# Patient Record
Sex: Female | Born: 1960 | Race: White | Hispanic: No | Smoking: Current every day smoker
Health system: Southern US, Community
[De-identification: ages and names within clinical notes are randomized; demographics above are authoritative.]

## PROBLEM LIST (undated history)

## (undated) DIAGNOSIS — G4730 Sleep apnea, unspecified: Secondary | ICD-10-CM

## (undated) DIAGNOSIS — E785 Hyperlipidemia, unspecified: Secondary | ICD-10-CM

## (undated) DIAGNOSIS — Z720 Tobacco use: Secondary | ICD-10-CM

## (undated) DIAGNOSIS — F32A Depression, unspecified: Secondary | ICD-10-CM

## (undated) DIAGNOSIS — R251 Tremor, unspecified: Secondary | ICD-10-CM

## (undated) DIAGNOSIS — F419 Anxiety disorder, unspecified: Secondary | ICD-10-CM

## (undated) DIAGNOSIS — K219 Gastro-esophageal reflux disease without esophagitis: Secondary | ICD-10-CM

## (undated) DIAGNOSIS — J189 Pneumonia, unspecified organism: Secondary | ICD-10-CM

## (undated) DIAGNOSIS — Z8719 Personal history of other diseases of the digestive system: Secondary | ICD-10-CM

## (undated) DIAGNOSIS — K760 Fatty (change of) liver, not elsewhere classified: Secondary | ICD-10-CM

## (undated) DIAGNOSIS — I10 Essential (primary) hypertension: Secondary | ICD-10-CM

## (undated) DIAGNOSIS — M1990 Unspecified osteoarthritis, unspecified site: Secondary | ICD-10-CM

## (undated) DIAGNOSIS — I6529 Occlusion and stenosis of unspecified carotid artery: Secondary | ICD-10-CM

## (undated) DIAGNOSIS — E119 Type 2 diabetes mellitus without complications: Secondary | ICD-10-CM

## (undated) HISTORY — DX: Tobacco use: Z72.0

## (undated) HISTORY — DX: Occlusion and stenosis of unspecified carotid artery: I65.29

## (undated) HISTORY — PX: TUBAL LIGATION: SHX77

## (undated) HISTORY — PX: UMBILICAL HERNIA REPAIR: SHX196

## (undated) HISTORY — DX: Hyperlipidemia, unspecified: E78.5

## (undated) HISTORY — PX: KNEE ARTHROSCOPY: SUR90

## (undated) HISTORY — PX: CARPAL TUNNEL RELEASE: SHX101

## (undated) HISTORY — PX: BACK SURGERY: SHX140

---

## 2020-01-07 ENCOUNTER — Other Ambulatory Visit: Payer: Self-pay | Admitting: Sports Medicine

## 2020-01-07 DIAGNOSIS — M545 Low back pain, unspecified: Secondary | ICD-10-CM

## 2020-01-26 ENCOUNTER — Other Ambulatory Visit: Payer: Self-pay

## 2020-01-26 ENCOUNTER — Ambulatory Visit
Admission: RE | Admit: 2020-01-26 | Discharge: 2020-01-26 | Disposition: A | Discharge status: Home / Self Care | Payer: Medicaid Other | Source: Ambulatory Visit | Attending: Sports Medicine | Admitting: Sports Medicine

## 2020-01-26 DIAGNOSIS — M545 Low back pain, unspecified: Secondary | ICD-10-CM

## 2021-03-02 ENCOUNTER — Other Ambulatory Visit: Payer: Self-pay | Admitting: Family Medicine

## 2021-03-02 DIAGNOSIS — Z87891 Personal history of nicotine dependence: Secondary | ICD-10-CM

## 2021-03-02 DIAGNOSIS — Z122 Encounter for screening for malignant neoplasm of respiratory organs: Secondary | ICD-10-CM

## 2021-03-12 ENCOUNTER — Other Ambulatory Visit: Payer: Self-pay | Admitting: Family Medicine

## 2021-03-12 DIAGNOSIS — Z780 Asymptomatic menopausal state: Secondary | ICD-10-CM

## 2021-03-12 DIAGNOSIS — Z1231 Encounter for screening mammogram for malignant neoplasm of breast: Secondary | ICD-10-CM

## 2021-04-03 ENCOUNTER — Ambulatory Visit: Payer: Medicaid Other

## 2021-04-28 NOTE — H&P (Signed)
KNEE ARTHROPLASTY ADMISSION H&P  Patient ID: Brittney Hunt MRN: [id number] DOB/AGE: 62/30/1962 62 y.o.  Chief Complaint: right knee pain.  Planned Procedure Date: 05/12/21 Medical and Cardiac Clearance by Ellender Hose NP-C     HPI: Brittney Hunt is a 61 y.o. female who presents for evaluation of OA RIGHT KNEE. The patient has a history of pain and functional disability in the right knee due to arthritis and has failed non-surgical conservative treatments for greater than 12 weeks to include corticosteriod injections, viscosupplementation injections, supervised PT with diminished ADL's post treatment, use of assistive devices, weight reduction as appropriate, and activity modification.  Onset of symptoms was gradual, starting >10 years ago with gradually worsening course since that time. The patient noted prior procedures on the knee to include  arthroscopy and menisectomy on the right knee.  Patient currently rates pain at 10 out of 10 with activity. Patient has night pain, worsening of pain with activity and weight bearing, and pain that interferes with activities of daily living.  Patient has evidence of subchondral sclerosis, periarticular osteophytes, and joint space narrowing by imaging studies.  There is no active infection.  No past medical history on file.  Not on File Prior to Admission medications   Not on File   Social History   Socioeconomic History   Marital status: Legally Separated    Spouse name: Not on file   Number of children: Not on file   Years of education: Not on file   Highest education level: Not on file  Occupational History   Not on file  Tobacco Use   Smoking status: Not on file   Smokeless tobacco: Not on file  Substance and Sexual Activity   Alcohol use: Not on file   Drug use: Not on file   Sexual activity: Not on file  Other Topics Concern   Not on file  Social History Narrative   Not on file   Social Determinants of Health   Financial  Resource Strain: Not on file  Food Insecurity: Not on file  Transportation Needs: Not on file  Physical Activity: Not on file  Stress: Not on file  Social Connections: Not on file   No family history on file.  ROS: Currently denies lightheadedness, dizziness, Fever, chills, CP, SOB.   No personal history of DVT, PE, MI, or CVA. No loose teeth or dentures All other systems have been reviewed and were otherwise currently negative with the exception of those mentioned in the HPI and as above.  Objective: Vitals: Ht: 5'7" Wt: 248.5 lbs Temp: 98.5 BP: 139/84 Pulse: 81 O2 97% on room air.   Physical Exam: General: Alert, NAD.  Antalgic Gait Essential tremor B/L UEs HEENT: EOMI, Good Neck Extension  Pulm: No increased work of breathing.  Clear B/L A/P in the upper lung fields only w/o crackle or wheeze. B/L lower lung fields have either rhonchi or rales. Coughed several times during exam. Needed inhaler.  CV: RRR, No m/g/r appreciated  GI: soft, NT, ND. BS x 4 quadrants Neuro: CN II-XII grossly intact without focal deficit.  Sensation intact distally Skin: Multiple erythematous rashes of several areas of the body. Unknown cause. No lesions in the area of chief complaint MSK/Surgical Site:  + JLT. ROM 40-120 degrees. Decreased strength in extension and flexion.  +EHL/FHL.  NVI.  Stable varus and valgus stress.    Imaging Review Plain radiographs demonstrate severe degenerative joint disease of the right knee.   The overall alignment  ismild valgus. The bone quality appears to be fair for age and reported activity level.  Preoperative templating of the joint replacement has been completed, documented, and submitted to the Operating Room personnel in order to optimize intra-operative equipment management.  Assessment: OA RIGHT KNEE Active Problems:   * No active hospital problems. *   Plan: Plan for Procedure(s): TOTAL KNEE ARTHROPLASTY  The patient history, physical exam, clinical  judgement of the provider and imaging are consistent with end stage degenerative joint disease and total joint arthroplasty is deemed medically necessary. The treatment options including medical management, injection therapy, and arthroplasty were discussed at length. The risks and benefits of Procedure(s): TOTAL KNEE ARTHROPLASTY were presented and reviewed.  The risks of nonoperative treatment, versus surgical intervention including but not limited to continued pain, aseptic loosening, stiffness, dislocation/subluxation, infection, bleeding, nerve injury, blood clots, cardiopulmonary complications, morbidity, mortality, among others were discussed. The patient verbalizes understanding and wishes to proceed with the plan.  Patient is being admitted for inpatient treatment for surgery, pain control, PT, prophylactic antibiotics, VTE prophylaxis, progressive ambulation, ADL's and discharge planning.   Dental prophylaxis discussed and recommended for 2 years postoperatively.  The patient does meet the criteria for TXA which will be used perioperatively.   ASA 81 mg BID will be used postoperatively for DVT prophylaxis in addition to SCDs, and early ambulation. Plan for Tylenol, Mobic, oxycodone for pain.   Robaxin for muscle spasms.   Zofran for nausea and vomiting. Pharmacy- [facility] on [address] The patient is planning to be discharged home with OPPT and into the care of her brothers Gala Romney and Onalee Hua who can be reached at [phone] or [phone] Follow up appt 05/27/21 at 4:15pm     Marzetta Board Office [phone] 04/28/2021 3:32 PM

## 2021-04-30 ENCOUNTER — Encounter (HOSPITAL_COMMUNITY): Payer: Self-pay

## 2021-04-30 NOTE — Patient Instructions (Addendum)
DUE TO COVID-19 ONLY ONE VISITOR IS ALLOWED TO COME WITH YOU AND STAY IN THE WAITING ROOM ONLY DURING PRE OP AND PROCEDURE.   **NO VISITORS ARE ALLOWED IN THE SHORT STAY AREA OR RECOVERY ROOM!!**   You are not required to quarantine, however you are required to wear a well-fitted mask when you are out and around people not in your household.  Hand Hygiene often Do NOT share personal items Notify your provider if you are in close contact with someone who has COVID or you develop fever 100.4 or greater, new onset of sneezing, cough, sore throat, shortness of breath or body aches.   Your procedure is scheduled on: Tuesday 05-12-21   [facility]     Report to admitting at 7:15 AM   Call this number if you have problems the morning of surgery [phone]   Do not eat food :After Midnight.   May have liquids until 7:00 AM day of surgery  CLEAR LIQUID DIET  Foods Allowed                                                                     Foods Excluded  Water, Black Coffee (no milk/no creamer) and tea, regular and decaf                              liquids that you cannot  Plain Jell-O in any flavor  (No red)                         see through such as: Fruit ices (not with fruit pulp)                                 milk, soups, orange juice  Iced Popsicles (No red)                                    All solid food                             Apple juices Sports drinks like Gatorade (No red) Lightly seasoned clear broth or consume(fat free) Sugar    Complete one G2 drink the morning of surgery at 7:00 AM the day of surgery.     The day of surgery:  Drink ONE (1) Pre-Surgery G2  the morning of surgery. Drink in one sitting. Do not sip.  This drink was given to you during your hospital  pre-op appointment visit. Nothing else to drink after completing the Pre-Surgery G2.          If you have questions, please contact your surgeons office.      Oral Hygiene is also important to reduce your risk of infection.                                    Remember - BRUSH YOUR TEETH  THE MORNING OF SURGERY WITH YOUR REGULAR TOOTHPASTE   Do NOT smoke after Midnight   Take these medicines the morning of surgery with A SIP OF WATER:  Tylenol, Amlodipine, Bupropion, Gabapentin, Omeprazole.  Okay to use inhalers   Stop all vitamins and herbal supplements a week before surgery             You may not have any metal on your body including hair pins, jewelry, and body piercing             Do not wear make-up, lotions, powders, perfumes or deodorant  Do not wear nail polish including gel and S&S, artificial/acrylic nails, or any other type of covering on natural nails including finger and toenails. If you have artificial nails, gel coating, etc. that needs to be removed by a nail salon please have this removed prior to surgery or surgery may need to be canceled/ delayed if the surgeon/ anesthesia feels like they are unable to be safely monitored.   Do not shave  48 hours prior to surgery.   Do not bring valuables to the hospital. [facility] IS NOT RESPONSIBLE FOR VALUABLES.   Contacts, dentures or bridgework may not be worn into surgery.   Patients discharged the day of surgery will not be allowed to drive home.   A responsible adult must remain with you for 24 hours after surgery.  Bring Bipap mask and tubing day of surgery  Please read over the following fact sheets you were given: IF YOU HAVE QUESTIONS ABOUT YOUR PRE OP INSTRUCTIONS PLEASE CALL [phone]   [facility] - Preparing for Surgery Before surgery, you can play an important role.  Because skin is not sterile, your skin needs to be as free of germs as possible.  You can reduce the number of germs on your skin by washing with an anitbacterial soap before surgery.    Do not shave (including legs and underarms) for at least 48 hours prior to the first CHG shower.  You may shave your  face/neck.  Please follow these instructions carefully:  1.  Shower with an antibacterial soap the night before surgery and the  morning of surgery.  2.  If you choose to wash your hair, wash your hair first as usual with your normal  shampoo.  3.  After you shampoo, rinse your hair and body thoroughly to remove the shampoo.                             4.  Wash thoroughly, paying special attention to the area where your    surgery  will be performed.  5.  Thoroughly rinse your body with warm water from the neck down.  6.  Pat yourself dry with a clean towel.            7.  Wear clean pajamas.            8.  Place clean sheets on your bed the night of your first shower and do not  sleep with pets.  Day of Surgery : Do not apply any lotions/deodorants the morning of surgery.  Please wear clean clothes to the hospital/surgery center.  FAILURE TO FOLLOW THESE INSTRUCTIONS MAY RESULT IN THE CANCELLATION OF YOUR SURGERY  PATIENT SIGNATURE_________________________________  NURSE SIGNATURE__________________________________  ________________________________________________________________________   Brittney Hunt  An incentive spirometer is a tool that can help keep your lungs clear and active. This tool measures  how well you are filling your lungs with each breath. Taking long deep breaths may help reverse or decrease the chance of developing breathing (pulmonary) problems (especially infection) following: A long period of time when you are unable to move or be active. BEFORE THE PROCEDURE  If the spirometer includes an indicator to show your best effort, your nurse or respiratory therapist will set it to a desired goal. If possible, sit up straight or lean slightly forward. Try not to slouch. Hold the incentive spirometer in an upright position. INSTRUCTIONS FOR USE  Sit on the edge of your bed if possible, or sit up as far as you can in bed or on a chair. Hold the incentive  spirometer in an upright position. Breathe out normally. Place the mouthpiece in your mouth and seal your lips tightly around it. Breathe in slowly and as deeply as possible, raising the piston or the ball toward the top of the column. Hold your breath for 3-5 seconds or for as long as possible. Allow the piston or ball to fall to the bottom of the column. Remove the mouthpiece from your mouth and breathe out normally. Rest for a few seconds and repeat Steps 1 through 7 at least 10 times every 1-2 hours when you are awake. Take your time and take a few normal breaths between deep breaths. The spirometer may include an indicator to show your best effort. Use the indicator as a goal to work toward during each repetition. After each set of 10 deep breaths, practice coughing to be sure your lungs are clear. If you have an incision (the cut made at the time of surgery), support your incision when coughing by placing a pillow or rolled up towels firmly against it. Once you are able to get out of bed, walk around indoors and cough well. You may stop using the incentive spirometer when instructed by your caregiver.  RISKS AND COMPLICATIONS Take your time so you do not get dizzy or light-headed. If you are in pain, you may need to take or ask for pain medication before doing incentive spirometry. It is harder to take a deep breath if you are having pain. AFTER USE Rest and breathe slowly and easily. It can be helpful to keep track of a log of your progress. Your caregiver can provide you with a simple table to help with this. If you are using the spirometer at home, follow these instructions: SEEK MEDICAL CARE IF:  You are having difficultly using the spirometer. You have trouble using the spirometer as often as instructed. Your pain medication is not giving enough relief while using the spirometer. You develop fever of 100.5 F (38.1 C) or higher. SEEK IMMEDIATE MEDICAL CARE IF:  You cough up bloody  sputum that had not been present before. You develop fever of 102 F (38.9 C) or greater. You develop worsening pain at or near the incision site. MAKE SURE YOU:  Understand these instructions. Will watch your condition. Will get help right away if you are not doing well or get worse. Document Released: 08/23/2006 Document Revised: 07/05/2011 Document Reviewed: 10/24/2006 [facility] Patient Information 2014 [place], [place].   ________________________________________________________________________

## 2021-05-05 ENCOUNTER — Encounter (HOSPITAL_COMMUNITY)
Admission: RE | Admit: 2021-05-05 | Discharge: 2021-05-05 | Disposition: A | Discharge status: Home / Self Care | Payer: Medicare Other | Source: Ambulatory Visit | Attending: Orthopedic Surgery | Admitting: Orthopedic Surgery

## 2021-05-05 ENCOUNTER — Encounter (HOSPITAL_COMMUNITY): Payer: Self-pay

## 2021-05-05 ENCOUNTER — Other Ambulatory Visit: Payer: Self-pay

## 2021-05-05 VITALS — BP 150/89 | HR 87 | Temp 98.4°F | Resp 18 | Ht 67.0 in | Wt 252.6 lb

## 2021-05-05 DIAGNOSIS — I2510 Atherosclerotic heart disease of native coronary artery without angina pectoris: Secondary | ICD-10-CM | POA: Insufficient documentation

## 2021-05-05 DIAGNOSIS — Z01818 Encounter for other preprocedural examination: Secondary | ICD-10-CM | POA: Diagnosis present

## 2021-05-05 DIAGNOSIS — R7303 Prediabetes: Secondary | ICD-10-CM

## 2021-05-05 HISTORY — DX: Anxiety disorder, unspecified: F41.9

## 2021-05-05 HISTORY — DX: Sleep apnea, unspecified: G47.30

## 2021-05-05 HISTORY — DX: Unspecified osteoarthritis, unspecified site: M19.90

## 2021-05-05 HISTORY — DX: Gastro-esophageal reflux disease without esophagitis: K21.9

## 2021-05-05 HISTORY — DX: Essential (primary) hypertension: I10

## 2021-05-05 HISTORY — DX: Tremor, unspecified: R25.1

## 2021-05-05 HISTORY — DX: Pneumonia, unspecified organism: J18.9

## 2021-05-05 HISTORY — DX: Depression, unspecified: F32.A

## 2021-05-05 HISTORY — DX: Type 2 diabetes mellitus without complications: E11.9

## 2021-05-05 LAB — SURGICAL PCR SCREEN
MRSA, PCR: NEGATIVE
Staphylococcus aureus: POSITIVE — AB

## 2021-05-05 LAB — GLUCOSE, CAPILLARY: Glucose-Capillary: 151 mg/dL — ABNORMAL HIGH (ref 70–99)

## 2021-05-05 NOTE — Progress Notes (Signed)
Left message for Brittney Hunt at Dr. Lindi Adie office that patient's orders have the incorrect extremity.  Patient is having a right TKA and orders/consent have L TKA.

## 2021-05-05 NOTE — Progress Notes (Signed)
PCR results sent to Dr. Murphy to review.   

## 2021-05-05 NOTE — Progress Notes (Addendum)
COVID swab appointment: N/A  COVID Vaccine Completed:  Yes x2 Date COVID Vaccine completed: 11-26-19 12-24-19 Has received booster: COVID vaccine manufacturer:     Moderna     Date of COVID positive in last 90 days: No  PCP - Ellender Hose, MD.  Note on chart.  Requested labs from 05-02-21 x3 Cardiologist - Fritz Pickerel, MD. Requested notes x2  Medical clearance on chart dated 02-13-21 by Dr. Moshe Salisbury  Chest x-ray - N/A EKG - 05-05-21 Epic Stress Test - N/A ECHO -  12-06-19. On chart Cardiac Cath - N/A Pacemaker/ICD device last checked: Spinal Cord Stimulator:  Sleep Study - Yes, +sleep apnea CPAP - Bipap  Fasting Blood Sugar -  Checks Blood Sugar - does not check home  Blood Thinner Instructions: Aspirin Instructions:  ASA 81.  To hold x1 week per patient Last Dose:  Activity level: Can go up a flight of stairs and perform activities of daily living without stopping and without symptoms of chest pain or shortness of breath.   Anesthesia review:  HTN, DM, OSA on Bipap.  Patient saw cardiology for check up due to medical issues, no cardiac symptoms per patient.  Patient denies shortness of breath, fever, cough and chest pain at PAT appointment  Patient verbalized understanding of instructions that were given to them at the PAT appointment. Patient was also instructed that they will need to review over the PAT instructions again at home before surgery.

## 2021-05-11 ENCOUNTER — Encounter (HOSPITAL_COMMUNITY): Payer: Self-pay | Admitting: Orthopedic Surgery

## 2021-05-11 NOTE — Anesthesia Preprocedure Evaluation (Addendum)
Anesthesia Evaluation  Patient identified by MRN, date of birth, ID band Patient awake    Reviewed: Allergy & Precautions, NPO status , Patient's Chart, lab work & pertinent test results  Airway Mallampati: II  TM Distance: >3 FB Neck ROM: Full    Dental no notable dental hx. (+) Poor Dentition, Dental Advisory Given,    Pulmonary sleep apnea , Current Smoker and Patient abstained from smoking.,    Pulmonary exam normal breath sounds clear to auscultation       Cardiovascular hypertension, Pt. on medications Normal cardiovascular exam Rhythm:Regular Rate:Normal     Neuro/Psych    GI/Hepatic   Endo/Other  diabetes  Renal/GU      Musculoskeletal  (+) Arthritis , Osteoarthritis,    Abdominal (+) - obese (BMI 39.6),   Peds  Hematology   Anesthesia Other Findings NKDA  Reproductive/Obstetrics                            Anesthesia Physical Anesthesia Plan  ASA: 3  Anesthesia Plan: Spinal and Regional   Post-op Pain Management: Regional block and Minimal or no pain anticipated   Induction:   PONV Risk Score and Plan: 2 and Treatment may vary due to age or medical condition, Midazolam and Ondansetron  Airway Management Planned: Natural Airway and Simple Face Mask  Additional Equipment: None  Intra-op Plan:   Post-operative Plan:   Informed Consent: I have reviewed the patients History and Physical, chart, labs and discussed the procedure including the risks, benefits and alternatives for the proposed anesthesia with the patient or authorized representative who has indicated his/her understanding and acceptance.     Dental advisory given  Plan Discussed with: CRNA and Anesthesiologist  Anesthesia Plan Comments: (Spinal w R adductor canal block)       Anesthesia Quick Evaluation

## 2021-05-12 ENCOUNTER — Ambulatory Visit (HOSPITAL_COMMUNITY): Payer: Medicare Other | Admitting: Physician Assistant

## 2021-05-12 ENCOUNTER — Ambulatory Visit (HOSPITAL_COMMUNITY): Payer: Medicare Other

## 2021-05-12 ENCOUNTER — Other Ambulatory Visit (HOSPITAL_COMMUNITY): Payer: Self-pay | Admitting: Orthopedic Surgery

## 2021-05-12 ENCOUNTER — Ambulatory Visit (HOSPITAL_COMMUNITY): Payer: Medicare Other | Admitting: Anesthesiology

## 2021-05-12 ENCOUNTER — Encounter (HOSPITAL_COMMUNITY): Payer: Self-pay | Admitting: Orthopedic Surgery

## 2021-05-12 ENCOUNTER — Ambulatory Visit (HOSPITAL_COMMUNITY)
Admission: RE | Admit: 2021-05-12 | Discharge: 2021-05-12 | Disposition: A | Discharge status: Home / Self Care | Payer: Medicare Other | Source: Ambulatory Visit | Attending: Orthopedic Surgery | Admitting: Orthopedic Surgery

## 2021-05-12 ENCOUNTER — Encounter (HOSPITAL_COMMUNITY)
Admission: RE | Disposition: A | Discharge status: Home / Self Care | Payer: Self-pay | Source: Ambulatory Visit | Attending: Orthopedic Surgery

## 2021-05-12 DIAGNOSIS — F17210 Nicotine dependence, cigarettes, uncomplicated: Secondary | ICD-10-CM | POA: Insufficient documentation

## 2021-05-12 DIAGNOSIS — E119 Type 2 diabetes mellitus without complications: Secondary | ICD-10-CM | POA: Diagnosis not present

## 2021-05-12 DIAGNOSIS — E669 Obesity, unspecified: Secondary | ICD-10-CM | POA: Insufficient documentation

## 2021-05-12 DIAGNOSIS — Z6839 Body mass index (BMI) 39.0-39.9, adult: Secondary | ICD-10-CM | POA: Diagnosis not present

## 2021-05-12 DIAGNOSIS — Z01818 Encounter for other preprocedural examination: Secondary | ICD-10-CM

## 2021-05-12 DIAGNOSIS — R262 Difficulty in walking, not elsewhere classified: Secondary | ICD-10-CM | POA: Insufficient documentation

## 2021-05-12 DIAGNOSIS — I2510 Atherosclerotic heart disease of native coronary artery without angina pectoris: Secondary | ICD-10-CM

## 2021-05-12 DIAGNOSIS — I10 Essential (primary) hypertension: Secondary | ICD-10-CM | POA: Diagnosis not present

## 2021-05-12 DIAGNOSIS — Z96651 Presence of right artificial knee joint: Secondary | ICD-10-CM

## 2021-05-12 DIAGNOSIS — M1711 Unilateral primary osteoarthritis, right knee: Secondary | ICD-10-CM | POA: Insufficient documentation

## 2021-05-12 DIAGNOSIS — R7303 Prediabetes: Secondary | ICD-10-CM

## 2021-05-12 DIAGNOSIS — G4730 Sleep apnea, unspecified: Secondary | ICD-10-CM | POA: Insufficient documentation

## 2021-05-12 HISTORY — PX: TOTAL KNEE ARTHROPLASTY: SHX125

## 2021-05-12 LAB — BASIC METABOLIC PANEL
Anion gap: 6 (ref 5–15)
BUN: 15 mg/dL (ref 8–23)
CO2: 28 mmol/L (ref 22–32)
Calcium: 9.1 mg/dL (ref 8.9–10.3)
Chloride: 102 mmol/L (ref 98–111)
Creatinine, Ser: 0.77 mg/dL (ref 0.44–1.00)
GFR, Estimated: >60 mL/min (ref >60)
Glucose, Bld: 145 mg/dL — ABNORMAL HIGH (ref 70–99)
Potassium: 4 mmol/L (ref 3.5–5.1)
Sodium: 136 mmol/L (ref 135–145)

## 2021-05-12 LAB — CBC
HCT: 40.2 % (ref 36.0–46.0)
Hemoglobin: 13.2 g/dL (ref 12.0–15.0)
MCH: 31 pg (ref 26.0–34.0)
MCHC: 32.8 g/dL (ref 30.0–36.0)
MCV: 94.4 fL (ref 80.0–100.0)
Platelets: 163 10*3/uL (ref 150–400)
RBC: 4.26 MIL/uL (ref 3.87–5.11)
RDW: 12.6 % (ref 11.5–15.5)
WBC: 5 10*3/uL (ref 4.0–10.5)
nRBC: 0 % (ref 0.0–0.2)

## 2021-05-12 LAB — GLUCOSE, CAPILLARY
Glucose-Capillary: 164 mg/dL — ABNORMAL HIGH (ref 70–99)
Glucose-Capillary: 123 mg/dL — ABNORMAL HIGH (ref 70–99)

## 2021-05-12 LAB — HEMOGLOBIN A1C
Hgb A1c MFr Bld: 5.6 % (ref 4.8–5.6)
Mean Plasma Glucose: 114.02 mg/dL

## 2021-05-12 SURGERY — ARTHROPLASTY, KNEE, TOTAL
Anesthesia: Regional | Site: Knee | Laterality: Right

## 2021-05-12 MED ORDER — TRANEXAMIC ACID-NACL 1000-0.7 MG/100ML-% IV SOLN: 1000.0000 mg | Freq: Once | INTRAVENOUS | Status: DC

## 2021-05-12 MED ORDER — ORAL CARE MOUTH RINSE: 15.0000 mL | Freq: Once | OROMUCOSAL | Status: DC

## 2021-05-12 MED ORDER — LACTATED RINGERS IV SOLN: INTRAVENOUS | Status: DC

## 2021-05-12 MED ORDER — BUPIVACAINE IN DEXTROSE 0.75-8.25 % IT SOLN
INTRATHECAL | Status: DC | PRN
Start: 2021-05-12 — End: 2021-05-12
  Administered 2021-05-12: 12 mg via INTRATHECAL

## 2021-05-12 MED ORDER — OXYCODONE HCL 5 MG PO TABS: 5.0000 mg | ORAL_TABLET | ORAL | 0 refills | Status: DC | PRN

## 2021-05-12 MED ORDER — CEFAZOLIN SODIUM-DEXTROSE 2-4 GM/100ML-% IV SOLN: 2.0000 g | Freq: Four times a day (QID) | INTRAVENOUS | Status: DC

## 2021-05-12 MED ORDER — BUPIVACAINE LIPOSOME 1.3 % IJ SUSP: 20.0000 mL | Freq: Once | INTRAMUSCULAR | Status: DC

## 2021-05-12 MED ORDER — METHOCARBAMOL 500 MG PO TABS
ORAL_TABLET | ORAL | Status: AC
  Administered 2021-05-12: 500 mg via ORAL
  Filled 2021-05-12: qty 1

## 2021-05-12 MED ORDER — ROPIVACAINE HCL 5 MG/ML IJ SOLN
INTRAMUSCULAR | Status: DC | PRN
  Administered 2021-05-12: 150 mg via PERINEURAL

## 2021-05-12 MED ORDER — ONDANSETRON HCL 4 MG/2ML IJ SOLN: 4.0000 mg | Freq: Four times a day (QID) | INTRAMUSCULAR | Status: DC | PRN

## 2021-05-12 MED ORDER — CHLORHEXIDINE GLUCONATE 0.12 % MT SOLN: 15.0000 mL | Freq: Once | OROMUCOSAL | Status: DC

## 2021-05-12 MED ORDER — PROPOFOL 500 MG/50ML IV EMUL
INTRAVENOUS | Status: DC | PRN
  Administered 2021-05-12: 75 ug/kg/min via INTRAVENOUS

## 2021-05-12 MED ORDER — ONDANSETRON HCL 4 MG/2ML IJ SOLN: 4.0000 mg | Freq: Once | INTRAMUSCULAR | Status: DC | PRN

## 2021-05-12 MED ORDER — OXYCODONE HCL 5 MG PO TABS
ORAL_TABLET | ORAL | Status: AC
  Administered 2021-05-12: 10 mg via ORAL
  Filled 2021-05-12: qty 2

## 2021-05-12 MED ORDER — EPHEDRINE SULFATE-NACL 50-0.9 MG/10ML-% IV SOSY
PREFILLED_SYRINGE | INTRAVENOUS | Status: DC | PRN
Start: 2021-05-12 — End: 2021-05-12
  Administered 2021-05-12: 10 mg via INTRAVENOUS

## 2021-05-12 MED ORDER — ASPIRIN 81 MG PO CHEW: 81.0000 mg | CHEWABLE_TABLET | Freq: Two times a day (BID) | ORAL | Status: DC

## 2021-05-12 MED ORDER — OXYCODONE HCL 5 MG PO TABS: 5.0000 mg | ORAL_TABLET | Freq: Once | ORAL | Status: DC | PRN

## 2021-05-12 MED ORDER — BISACODYL 10 MG RE SUPP: 10.0000 mg | Freq: Every day | RECTAL | Status: DC | PRN

## 2021-05-12 MED ORDER — BUPIVACAINE-EPINEPHRINE (PF) 0.25% -1:200000 IJ SOLN
INTRAMUSCULAR | Status: AC
  Filled 2021-05-12: qty 30

## 2021-05-12 MED ORDER — DEXAMETHASONE SODIUM PHOSPHATE 10 MG/ML IJ SOLN: 8.0000 mg | Freq: Once | INTRAMUSCULAR | Status: DC

## 2021-05-12 MED ORDER — ACETAMINOPHEN 500 MG PO TABS
1000.0000 mg | ORAL_TABLET | Freq: Once | ORAL | Status: DC
  Filled 2021-05-12: qty 2

## 2021-05-12 MED ORDER — MIDAZOLAM HCL 5 MG/5ML IJ SOLN
INTRAMUSCULAR | Status: DC | PRN
  Administered 2021-05-12 (×2): 1 mg via INTRAVENOUS

## 2021-05-12 MED ORDER — ACETAMINOPHEN 500 MG PO TABS: 1000.0000 mg | ORAL_TABLET | Freq: Four times a day (QID) | ORAL | Status: DC

## 2021-05-12 MED ORDER — OXYCODONE HCL 5 MG/5ML PO SOLN: 5.0000 mg | Freq: Once | ORAL | Status: DC | PRN

## 2021-05-12 MED ORDER — ONDANSETRON HCL 4 MG PO TABS: 4.0000 mg | ORAL_TABLET | Freq: Four times a day (QID) | ORAL | Status: DC | PRN

## 2021-05-12 MED ORDER — SODIUM CHLORIDE (PF) 0.9 % IJ SOLN
INTRAMUSCULAR | Status: AC
  Filled 2021-05-12: qty 30

## 2021-05-12 MED ORDER — CEFAZOLIN SODIUM-DEXTROSE 2-4 GM/100ML-% IV SOLN
2.0000 g | INTRAVENOUS | Status: AC
  Administered 2021-05-12: 2 g via INTRAVENOUS
  Filled 2021-05-12: qty 100

## 2021-05-12 MED ORDER — ONDANSETRON HCL 4 MG/2ML IJ SOLN
INTRAMUSCULAR | Status: AC
  Administered 2021-05-12: 4 mg via INTRAVENOUS
  Filled 2021-05-12: qty 2

## 2021-05-12 MED ORDER — ACETAMINOPHEN 10 MG/ML IV SOLN: 1000.0000 mg | Freq: Once | INTRAVENOUS | Status: DC | PRN

## 2021-05-12 MED ORDER — HYDROMORPHONE HCL 1 MG/ML IJ SOLN: 0.2500 mg | INTRAMUSCULAR | Status: DC | PRN

## 2021-05-12 MED ORDER — SODIUM CHLORIDE 0.9% FLUSH
INTRAVENOUS | Status: DC | PRN
  Administered 2021-05-12: 30 mL

## 2021-05-12 MED ORDER — ALUM & MAG HYDROXIDE-SIMETH 200-200-20 MG/5ML PO SUSP: 30.0000 mL | ORAL | Status: DC | PRN

## 2021-05-12 MED ORDER — TRAMADOL HCL 50 MG PO TABS: 50.0000 mg | ORAL_TABLET | Freq: Four times a day (QID) | ORAL | Status: DC

## 2021-05-12 MED ORDER — ACETAMINOPHEN 325 MG PO TABS: 325.0000 mg | ORAL_TABLET | Freq: Four times a day (QID) | ORAL | Status: DC | PRN

## 2021-05-12 MED ORDER — HYDROMORPHONE HCL 1 MG/ML IJ SOLN
0.5000 mg | INTRAMUSCULAR | Status: DC | PRN
  Administered 2021-05-12: 0.5 mg via INTRAVENOUS

## 2021-05-12 MED ORDER — METHOCARBAMOL 500 MG PO TABS: 500.0000 mg | ORAL_TABLET | Freq: Four times a day (QID) | ORAL | Status: DC | PRN

## 2021-05-12 MED ORDER — OXYCODONE HCL 5 MG PO TABS: 5.0000 mg | ORAL_TABLET | ORAL | Status: DC | PRN

## 2021-05-12 MED ORDER — DEXAMETHASONE SODIUM PHOSPHATE 10 MG/ML IJ SOLN: 10.0000 mg | Freq: Once | INTRAMUSCULAR | Status: DC

## 2021-05-12 MED ORDER — METHOCARBAMOL 500 MG PO TABS: 500.0000 mg | ORAL_TABLET | Freq: Three times a day (TID) | ORAL | 0 refills | Status: AC | PRN

## 2021-05-12 MED ORDER — TRAMADOL HCL 50 MG PO TABS
ORAL_TABLET | ORAL | Status: AC
  Administered 2021-05-12: 50 mg via ORAL
  Filled 2021-05-12: qty 1

## 2021-05-12 MED ORDER — WATER FOR IRRIGATION, STERILE IR SOLN
Status: DC | PRN
  Administered 2021-05-12: 2000 mL

## 2021-05-12 MED ORDER — PANTOPRAZOLE SODIUM 40 MG PO TBEC: 40.0000 mg | DELAYED_RELEASE_TABLET | Freq: Every day | ORAL | Status: DC

## 2021-05-12 MED ORDER — POLYETHYLENE GLYCOL 3350 17 G PO PACK: 17.0000 g | PACK | Freq: Every day | ORAL | Status: DC | PRN

## 2021-05-12 MED ORDER — POVIDONE-IODINE 10 % EX SWAB
2.0000 "application " | Freq: Once | CUTANEOUS | Status: AC
  Administered 2021-05-12: 2 via TOPICAL

## 2021-05-12 MED ORDER — POVIDONE-IODINE 10 % EX SWAB: 2.0000 "application " | Freq: Once | CUTANEOUS | Status: DC

## 2021-05-12 MED ORDER — MIDAZOLAM HCL 2 MG/2ML IJ SOLN
1.0000 mg | Freq: Once | INTRAMUSCULAR | Status: AC
  Administered 2021-05-12: 1 mg via INTRAVENOUS
  Filled 2021-05-12: qty 2

## 2021-05-12 MED ORDER — LIDOCAINE 2% (20 MG/ML) 5 ML SYRINGE
INTRAMUSCULAR | Status: DC | PRN
  Administered 2021-05-12: 50 mg via INTRAVENOUS

## 2021-05-12 MED ORDER — CLONIDINE HCL (ANALGESIA) 100 MCG/ML EP SOLN
EPIDURAL | Status: DC | PRN
  Administered 2021-05-12: 100 ug

## 2021-05-12 MED ORDER — SODIUM CHLORIDE 0.9 % IR SOLN
Status: DC | PRN
  Administered 2021-05-12: 1000 mL

## 2021-05-12 MED ORDER — AMISULPRIDE (ANTIEMETIC) 5 MG/2ML IV SOLN: 10.0000 mg | Freq: Once | INTRAVENOUS | Status: DC | PRN

## 2021-05-12 MED ORDER — MIDAZOLAM HCL 2 MG/2ML IJ SOLN
INTRAMUSCULAR | Status: AC
  Filled 2021-05-12: qty 2

## 2021-05-12 MED ORDER — MENTHOL 3 MG MT LOZG: 1.0000 | LOZENGE | OROMUCOSAL | Status: DC | PRN

## 2021-05-12 MED ORDER — PROPOFOL 10 MG/ML IV BOLUS
INTRAVENOUS | Status: DC | PRN
  Administered 2021-05-12: 10 mg via INTRAVENOUS

## 2021-05-12 MED ORDER — PHENOL 1.4 % MT LIQD: 1.0000 | OROMUCOSAL | Status: DC | PRN

## 2021-05-12 MED ORDER — EPHEDRINE 5 MG/ML INJ
INTRAVENOUS | Status: AC
  Filled 2021-05-12: qty 5

## 2021-05-12 MED ORDER — ONDANSETRON HCL 4 MG/2ML IJ SOLN
INTRAMUSCULAR | Status: DC | PRN
Start: 2021-05-12 — End: 2021-05-12
  Administered 2021-05-12: 4 mg via INTRAVENOUS

## 2021-05-12 MED ORDER — DOCUSATE SODIUM 100 MG PO CAPS: 100.0000 mg | ORAL_CAPSULE | Freq: Two times a day (BID) | ORAL | Status: DC

## 2021-05-12 MED ORDER — LACTATED RINGERS IV BOLUS
500.0000 mL | Freq: Once | INTRAVENOUS | Status: AC
  Administered 2021-05-12: 500 mL via INTRAVENOUS

## 2021-05-12 MED ORDER — DEXAMETHASONE SODIUM PHOSPHATE 10 MG/ML IJ SOLN
INTRAMUSCULAR | Status: DC | PRN
  Administered 2021-05-12: 4 mg via INTRAVENOUS

## 2021-05-12 MED ORDER — METOCLOPRAMIDE HCL 5 MG PO TABS: 5.0000 mg | ORAL_TABLET | Freq: Three times a day (TID) | ORAL | Status: DC | PRN

## 2021-05-12 MED ORDER — HYDROMORPHONE HCL 1 MG/ML IJ SOLN
INTRAMUSCULAR | Status: AC
  Administered 2021-05-12: 0.5 mg via INTRAVENOUS
  Filled 2021-05-12: qty 1

## 2021-05-12 MED ORDER — OXYCODONE HCL 5 MG PO TABS: 10.0000 mg | ORAL_TABLET | ORAL | Status: DC | PRN

## 2021-05-12 MED ORDER — FENTANYL CITRATE PF 50 MCG/ML IJ SOSY
50.0000 ug | PREFILLED_SYRINGE | Freq: Once | INTRAMUSCULAR | Status: AC
  Administered 2021-05-12: 50 ug via INTRAVENOUS
  Filled 2021-05-12: qty 2

## 2021-05-12 MED ORDER — 0.9 % SODIUM CHLORIDE (POUR BTL) OPTIME
TOPICAL | Status: DC | PRN
  Administered 2021-05-12: 1000 mL

## 2021-05-12 MED ORDER — DIPHENHYDRAMINE HCL 12.5 MG/5ML PO ELIX: 12.5000 mg | ORAL_SOLUTION | ORAL | Status: DC | PRN

## 2021-05-12 MED ORDER — BUPIVACAINE-EPINEPHRINE 0.25% -1:200000 IJ SOLN
INTRAMUSCULAR | Status: DC | PRN
  Administered 2021-05-12: 30 mL

## 2021-05-12 MED ORDER — METOCLOPRAMIDE HCL 5 MG/ML IJ SOLN: 5.0000 mg | Freq: Three times a day (TID) | INTRAMUSCULAR | Status: DC | PRN

## 2021-05-12 MED ORDER — LACTATED RINGERS IV BOLUS: 250.0000 mL | Freq: Once | INTRAVENOUS | Status: DC

## 2021-05-12 MED ORDER — ACETAMINOPHEN 500 MG PO TABS
ORAL_TABLET | ORAL | Status: AC
  Administered 2021-05-12: 1000 mg via ORAL
  Filled 2021-05-12: qty 2

## 2021-05-12 MED ORDER — METHOCARBAMOL 500 MG IVPB - SIMPLE MED: 500.0000 mg | Freq: Four times a day (QID) | INTRAVENOUS | Status: DC | PRN

## 2021-05-12 MED ORDER — LACTATED RINGERS IV BOLUS
250.0000 mL | Freq: Once | INTRAVENOUS | Status: AC
  Administered 2021-05-12: 250 mL via INTRAVENOUS

## 2021-05-12 MED ORDER — BUPIVACAINE LIPOSOME 1.3 % IJ SUSP
INTRAMUSCULAR | Status: AC
  Filled 2021-05-12: qty 20

## 2021-05-12 MED ORDER — PHENYLEPHRINE HCL-NACL 20-0.9 MG/250ML-% IV SOLN
INTRAVENOUS | Status: DC | PRN
  Administered 2021-05-12: 20 ug/min via INTRAVENOUS

## 2021-05-12 MED ORDER — TRANEXAMIC ACID-NACL 1000-0.7 MG/100ML-% IV SOLN
1000.0000 mg | INTRAVENOUS | Status: AC
  Administered 2021-05-12: 1000 mg via INTRAVENOUS
  Filled 2021-05-12: qty 100

## 2021-05-12 MED ORDER — BUPIVACAINE LIPOSOME 1.3 % IJ SUSP
INTRAMUSCULAR | Status: DC | PRN
  Administered 2021-05-12: 20 mL

## 2021-05-12 SURGICAL SUPPLY — 54 items
BAG COUNTER SPONGE SURGICOUNT (BAG) IMPLANT
BLADE HEX COATED 2.75 (ELECTRODE) ×2 IMPLANT
BLADE SAG 18X100X1.27 (BLADE) ×2 IMPLANT
BLADE SAGITTAL 25.0X1.37X90 (BLADE) ×2 IMPLANT
BLADE SURG 15 STRL LF DISP TIS (BLADE) ×1 IMPLANT
BLADE SURG 15 STRL SS (BLADE) ×2
BLADE SURG SZ10 CARB STEEL (BLADE) ×4 IMPLANT
BNDG CMPR MED 10X6 ELC LF (GAUZE/BANDAGES/DRESSINGS) ×1
BNDG CMPR MED 15X6 ELC VLCR LF (GAUZE/BANDAGES/DRESSINGS) ×1
BNDG ELASTIC 6X10 VLCR STRL LF (GAUZE/BANDAGES/DRESSINGS) ×2 IMPLANT
BNDG ELASTIC 6X15 VLCR STRL LF (GAUZE/BANDAGES/DRESSINGS) ×1 IMPLANT
BOWL SMART MIX CTS (DISPOSABLE) IMPLANT
CLSR STERI-STRIP ANTIMIC 1/2X4 (GAUZE/BANDAGES/DRESSINGS) ×2 IMPLANT
COMPONENT TRI CR FEM SZ5 KNEE (Orthopedic Implant) IMPLANT
COVER SURGICAL LIGHT HANDLE (MISCELLANEOUS) ×2 IMPLANT
CUFF TOURN SGL QUICK 34 (TOURNIQUET CUFF) ×2
CUFF TRNQT CYL 34X4.125X (TOURNIQUET CUFF) ×1 IMPLANT
DECANTER SPIKE VIAL GLASS SM (MISCELLANEOUS) ×4 IMPLANT
DRAPE INCISE IOBAN 66X45 STRL (DRAPES) ×6 IMPLANT
DRAPE U-SHAPE 47X51 STRL (DRAPES) ×2 IMPLANT
DRSG MEPILEX BORDER 4X12 (GAUZE/BANDAGES/DRESSINGS) ×2 IMPLANT
DURAPREP 26ML APPLICATOR (WOUND CARE) ×4 IMPLANT
GLOVE SRG 8 PF TXTR STRL LF DI (GLOVE) ×1 IMPLANT
GLOVE SURG ENC MOIS LTX SZ7.5 (GLOVE) ×2 IMPLANT
GLOVE SURG POLYISO LF SZ7.5 (GLOVE) ×2 IMPLANT
GLOVE SURG UNDER POLY LF SZ7.5 (GLOVE) ×2 IMPLANT
GLOVE SURG UNDER POLY LF SZ8 (GLOVE) ×2
GOWN STRL REUS W/TWL LRG LVL3 (GOWN DISPOSABLE) ×2 IMPLANT
GOWN STRL REUS W/TWL XL LVL3 (GOWN DISPOSABLE) ×2 IMPLANT
HANDPIECE INTERPULSE COAX TIP (DISPOSABLE) ×2
HOLDER FOLEY CATH W/STRAP (MISCELLANEOUS) IMPLANT
IMMOBILIZER KNEE 20 (SOFTGOODS) ×2
IMMOBILIZER KNEE 20 THIGH 36 (SOFTGOODS) IMPLANT
IMMOBILIZER KNEE 22 UNIV (SOFTGOODS) ×1 IMPLANT
INSERT TRIATH CS X3 11 (Insert) ×1 IMPLANT
KNEE PATELLA ASYMMETRIC 10X32 (Knees) ×1 IMPLANT
KNEE TIBIAL COMPONENT SZ5 (Knees) ×1 IMPLANT
MANIFOLD NEPTUNE II (INSTRUMENTS) ×2 IMPLANT
NS IRRIG 1000ML POUR BTL (IV SOLUTION) ×2 IMPLANT
PACK TOTAL KNEE CUSTOM (KITS) ×2 IMPLANT
PROTECTOR NERVE ULNAR (MISCELLANEOUS) ×2 IMPLANT
SET HNDPC FAN SPRY TIP SCT (DISPOSABLE) ×1 IMPLANT
SPONGE T-LAP 18X18 ~~LOC~~+RFID (SPONGE) ×5 IMPLANT
STRIP CLOSURE SKIN 1/2X4 (GAUZE/BANDAGES/DRESSINGS) ×1 IMPLANT
SUT MNCRL AB 3-0 PS2 18 (SUTURE) ×2 IMPLANT
SUT VIC AB 0 CT1 36 (SUTURE) ×2 IMPLANT
SUT VIC AB 1 CT1 36 (SUTURE) ×3 IMPLANT
SUT VIC AB 2-0 CT1 27 (SUTURE) ×2
SUT VIC AB 2-0 CT1 TAPERPNT 27 (SUTURE) ×1 IMPLANT
TRAY FOLEY MTR SLVR 14FR STAT (SET/KITS/TRAYS/PACK) IMPLANT
TRAY FOLEY MTR SLVR 16FR STAT (SET/KITS/TRAYS/PACK) IMPLANT
TRI CRUC RET FEM SZ5 KNEE (Orthopedic Implant) ×2 IMPLANT
TUBE SUCTION HIGH CAP CLEAR NV (SUCTIONS) ×2 IMPLANT
WRAP KNEE MAXI GEL POST OP (GAUZE/BANDAGES/DRESSINGS) ×2 IMPLANT

## 2021-05-12 NOTE — Discharge Instructions (Signed)

## 2021-05-12 NOTE — Op Note (Signed)
DATE OF SURGERY:  05/12/2021 TIME: 12:17 PM  PATIENT NAME:  Brittney Hunt   AGE: 61 y.o.    PRE-OPERATIVE DIAGNOSIS:  OA RIGHT KNEE  POST-OPERATIVE DIAGNOSIS:  Same  PROCEDURE:  Procedure(s): TOTAL KNEE ARTHROPLASTY   SURGEON:  Sheral Apley, MD   ASSISTANT:  Levester Fresh, PA-C, he was present and scrubbed throughout the case, critical for completion in a timely fashion, and for retraction, instrumentation, and closure.    OPERATIVE IMPLANTS: Stryker Triathlon CR. Press fit knee  Femur size 5, Tibia size 5, Patella size 32 3-peg oval button, with a 11 mm polyethylene insert.   PREOPERATIVE INDICATIONS:  Brittney Hunt is a 61 y.o. year old female with end stage bone on bone degenerative arthritis of the knee who failed conservative treatment, including injections, antiinflammatories, activity modification, and assistive devices, and had significant impairment of their activities of daily living, and elected for Total Knee Arthroplasty.   The risks, benefits, and alternatives were discussed at length including but not limited to the risks of infection, bleeding, nerve injury, stiffness, blood clots, the need for revision surgery, cardiopulmonary complications, among others, and they were willing to proceed.   OPERATIVE DESCRIPTION:  The patient was brought to the operative room and placed in a supine position.  General anesthesia was administered.  IV antibiotics were given.  The lower extremity was prepped and draped in the usual sterile fashion.  Time out was performed.  The leg was elevated and exsanguinated and the tourniquet was inflated.  Anterior approach was performed.  The patella was everted and osteophytes were removed.  The anterior horn of the medial and lateral meniscus was removed.   The distal femur was opened with the drill and the intramedullary distal femoral cutting jig was utilized, set at 5 degrees resecting 8 mm off the distal femur.  Care was taken to  protect the collateral ligaments.  The distal femoral sizing jig was applied, taking care to avoid notching.  Then the 4-in-1 cutting jig was applied and the anterior and posterior femur was cut, along with the chamfer cuts.  All posterior osteophytes were removed.  The flexion gap was then measured and was symmetric with the extension gap.  Then the extramedullary tibial cutting jig was utilized making the appropriate cut using the anterior tibial crest as a reference building in appropriate posterior slope.  Care was taken during the cut to protect the medial and collateral ligaments.  The proximal tibia was removed along with the posterior horns of the menisci.  The PCL was sacrificed.    The extensor gap was measured and was approximately 54mm.    I completed the distal femoral preparation using the appropriate jig to prepare the box.  The patella was then measured, and cut with the saw.    The proximal tibia sized and prepared accordingly with the reamer and the punch, and then all components were trialed with the above sized poly insert.  The knee was found to have excellent balance and full motion.    The above named components were then impacted into place and Poly tibial piece and patella were inserted.  I was very happy with his stability and ROM  I performed a periarticular injection with marcaine and toradol  The knee was easily taken through a range of motion and the patella tracked well and the knee irrigated copiously and the parapatellar and subcutaneous tissue closed with vicryl, and monocryl with steri strips for the skin.  The  incision was dressed with sterile gauze and the tourniquet released and the patient was awakened and returned to the PACU in stable and satisfactory condition.  There were no complications.  Total tourniquet time was roughly 60 minutes.   POSTOPERATIVE PLAN: post op Abx, DVT px: SCD's, TED's, Early ambulation and chemical px

## 2021-05-12 NOTE — Anesthesia Procedure Notes (Signed)
Spinal  Patient location during procedure: OR Start time: 05/12/2021 10:57 AM End time: 05/12/2021 11:02 AM Reason for block: surgical anesthesia Staffing Performed: anesthesiologist  Anesthesiologist: Trevor Iha, MD Preanesthetic Checklist Completed: patient identified, IV checked, risks and benefits discussed, surgical consent, monitors and equipment checked, pre-op evaluation and timeout performed Spinal Block Patient position: sitting Prep: DuraPrep and site prepped and draped Patient monitoring: heart rate, cardiac monitor, continuous pulse ox and blood pressure Approach: midline Location: L3-4 Injection technique: single-shot Needle Needle type: Pencan  Needle gauge: 24 G Needle length: 10 cm Needle insertion depth: 8 cm Assessment Sensory level: T4 Events: CSF return Additional Notes  Attempt (s). Pt tolerated procedure well.

## 2021-05-12 NOTE — Anesthesia Postprocedure Evaluation (Signed)
Anesthesia Post Note  Patient: Brittney Hunt  Procedure(s) Performed: TOTAL KNEE ARTHROPLASTY (Right: Knee)     Patient location during evaluation: Nursing Unit Anesthesia Type: Regional Level of consciousness: oriented and awake and alert Pain management: pain level controlled Vital Signs Assessment: post-procedure vital signs reviewed and stable Respiratory status: spontaneous breathing and respiratory function stable Cardiovascular status: blood pressure returned to baseline and stable Postop Assessment: no headache, no backache, no apparent nausea or vomiting and patient able to bend at knees Anesthetic complications: no   No notable events documented.  Last Vitals:  Vitals:   05/12/21 1303 05/12/21 1315  BP: (!) 108/91 130/79  Pulse: 81 80  Resp: 18 (!) 26  Temp: 36.5 C   SpO2: 96% 91%    Last Pain:  Vitals:   05/12/21 1315  TempSrc:   PainSc: 0-No pain                 Barnet Glasgow

## 2021-05-12 NOTE — Progress Notes (Signed)
Patient called because she never got her post op meds after TKA today with Dr. Eulah Pont. Pended scripts in chart but not signed. New prescriptions for robaxin and oxy sent. Patient states she has aspirin at home.

## 2021-05-12 NOTE — Interval H&P Note (Signed)
History and Physical Interval Note:  05/12/2021 7:22 AM  [name]  has presented today for surgery, with the diagnosis of OA RIGHT KNEE.  The various methods of treatment have been discussed with the patient and family. After consideration of risks, benefits and other options for treatment, the patient has consented to  Procedure(s): TOTAL KNEE ARTHROPLASTY (Right) as a surgical intervention.  The patient's history has been reviewed, patient examined, no change in status, stable for surgery.  I have reviewed the patient's chart and labs.  Questions were answered to the patient's satisfaction.     [name]

## 2021-05-12 NOTE — Progress Notes (Signed)
AssistedDr. Houser with right, ultrasound guided, adductor canal block. Side rails up, monitors on throughout procedure. See vital signs in flow sheet. Tolerated Procedure well.  

## 2021-05-12 NOTE — Anesthesia Procedure Notes (Signed)
Anesthesia Regional Block: Adductor canal block   Pre-Anesthetic Checklist: , timeout performed,  Correct Patient, Correct Site, Correct Laterality,  Correct Procedure, Correct Position, site marked,  Risks and benefits discussed,  Surgical consent,  Pre-op evaluation,  At surgeon's request and post-op pain management  Laterality: Lower and Right  Prep: chloraprep       Needles:  Injection technique: Single-shot  Needle Type: Echogenic Needle     Needle Length: 9cm  Needle Gauge: 22     Additional Needles:   Procedures:,,,, ultrasound used (permanent image in chart),,    Narrative:  Start time: 05/12/2021 9:05 AM End time: 05/12/2021 9:10 AM Injection made incrementally with aspirations every 5 mL.  Performed by: Personally  Anesthesiologist: Trevor Iha, MD  Additional Notes: Block assessed prior to surgery. Pt tolerated procedure well.

## 2021-05-12 NOTE — Evaluation (Signed)
Physical Therapy One Time Evaluation Patient Details Name: Brittney Hunt MRN: [id number] DOB: 07-08-1960 Today's Date: 05/12/2021  History of Present Illness  Pt is a 61 year old female s/p Right TKA on 05/12/21.  PMHx: depression, anxiety, DM, tremors  Clinical Impression  Patient evaluated by Physical Therapy with no further acute PT needs identified. All education has been completed and the patient has no further questions. Pt seen in PACU.  Pt eagerly requesting assist into bathroom (not bed pan or BSC).  Pt educated on KI use and wearing upon d/c.  Pt practiced safe stair technique and verbally reviewed exercises.  Pt reports understanding and feels ready for d/c home today.  Pt lives with 2 brothers who can assist her upon d/c if needed.  Pt plans to f/u with OPPT on 1/24 however provided with HEP handout as well.  Pt has both crutches and RW at home and educated to use RW upon d/c for improved stability and safety (pt reports she is not very stable with crutches anyways).  See below for any follow-up Physical Therapy or equipment needs. PT is signing off. Thank you for this referral.      Recommendations for follow up therapy are one component of a multi-disciplinary discharge planning process, led by the attending physician.  Recommendations may be updated based on patient status, additional functional criteria and insurance authorization.  Follow Up Recommendations Follow physician's recommendations for discharge plan and follow up therapies    Assistance Recommended at Discharge PRN  Patient can return home with the following  Help with stairs or ramp for entrance    Equipment Recommendations None recommended by PT  Recommendations for Other Services       Functional Status Assessment       Precautions / Restrictions Precautions Precautions: Fall;Knee Required Braces or Orthoses: Knee Immobilizer - Right Knee Immobilizer - Right: Other (comment) ("until block wears  off") Restrictions Weight Bearing Restrictions: No Other Position/Activity Restrictions: WBAT      Mobility  Bed Mobility Overal bed mobility: Needs Assistance Bed Mobility: Supine to Sit     Supine to sit: Supervision, HOB elevated     General bed mobility comments: verbal cues for safety    Transfers Overall transfer level: Needs assistance Equipment used: Rolling walker (2 wheels) Transfers: Sit to/from Stand Sit to Stand: Min guard           General transfer comment: min/guard for safety, verbal cues for UE and LE positioning    Ambulation/Gait Ambulation/Gait assistance: Min guard Gait Distance (Feet): 80 Feet Assistive device: Rolling walker (2 wheels) Gait Pattern/deviations: Step-to pattern, Antalgic, Decreased stance time - right       General Gait Details: verbal cues for sequence, RW Positioning, step length, posture  Stairs Stairs: Yes Stairs assistance: Min guard Stair Management: Step to pattern, Forwards, Two rails Number of Stairs: 3 General stair comments: verbal cues for sequence and safety; performed twice  Wheelchair Mobility    Modified Rankin (Stroke Patients Only)       Balance                                             Pertinent Vitals/Pain Pain Assessment Pain Assessment: 0-10 Pain Score: 7  Pain Location: right knee Pain Descriptors / Indicators: Throbbing Pain Intervention(s): Repositioned, Monitored during session    Home Living Family/patient expects to  be discharged to:: Private residence Living Arrangements: Other relatives (2 brothers)     Home Access: Stairs to enter Entrance Stairs-Rails: Right;Left;Can reach both Technical brewer of Steps: [place]: One level Home Equipment: Conservation officer, nature (2 wheels);Crutches      Prior Function Prior Level of Function : Independent/Modified Independent                     Hand Dominance        Extremity/Trunk Assessment         Lower Extremity Assessment Lower Extremity Assessment: RLE deficits/detail RLE Deficits / Details: unable to perform SLR, able to perform ankle pumps, maintained KI for mobility in PACU and educated pt on use at home.       Communication   Communication: No difficulties  Cognition Arousal/Alertness: Awake/alert Behavior During Therapy: WFL for tasks assessed/performed Overall Cognitive Status: Within Functional Limits for tasks assessed                                          General Comments      Exercises     Assessment/Plan    PT Assessment All further PT needs can be met in the next venue of care  PT Problem List Decreased strength;Decreased range of motion;Decreased knowledge of use of DME;Pain       PT Treatment Interventions      PT Goals (Current goals can be found in the Care Plan section)  Acute Rehab PT Goals PT Goal Formulation: All assessment and education complete, DC therapy    Frequency       Co-evaluation               AM-PAC PT "6 Clicks" Mobility  Outcome Measure Help needed turning from your back to your side while in a flat bed without using bedrails?: A Little Help needed moving from lying on your back to sitting on the side of a flat bed without using bedrails?: A Little Help needed moving to and from a bed to a chair (including a wheelchair)?: A Little Help needed standing up from a chair using your arms (e.g., wheelchair or bedside chair)?: A Little Help needed to walk in hospital room?: A Little Help needed climbing 3-5 steps with a railing? : A Little 6 Click Score: 18    End of Session Equipment Utilized During Treatment: Gait belt;Right knee immobilizer Activity Tolerance: Patient tolerated treatment well Patient left: with call bell/phone within reach;in chair (pt soaked with urine, left in chair, RN aware) Nurse Communication: Mobility status PT Visit Diagnosis: Difficulty in walking, not elsewhere  classified (R26.2);Pain Pain - Right/Left: Right Pain - part of body: Knee    Time: 8616-8372 PT Time Calculation (min) (ACUTE ONLY): 22 min   Jannette Spanner PT, DPT Acute Rehabilitation Services Pager: [phone] Office: [phone]   Kati L Payson 05/12/2021, 4:20 PM

## 2021-05-12 NOTE — Transfer of Care (Signed)
Immediate Anesthesia Transfer of Care Note  Patient: Brittney Hunt  Procedure(s) Performed: Procedure(s): TOTAL KNEE ARTHROPLASTY (Right)  Patient Location: PACU  Anesthesia Type:Spinal  Level of Consciousness:  sedated, patient cooperative and responds to stimulation  Airway & Oxygen Therapy:Patient Spontanous Breathing and Patient connected to face mask oxgen  Post-op Assessment:  Report given to PACU RN and Post -op Vital signs reviewed and stable  Post vital signs:  Reviewed and stable  Last Vitals:  Vitals:   05/12/21 0915 05/12/21 1044  BP: (!) 118/52 98/81  Pulse: 68 66  Resp: 19 18  Temp:    SpO2: 95% 100%    Complications: No apparent anesthesia complications

## 2021-05-14 ENCOUNTER — Encounter (HOSPITAL_COMMUNITY): Payer: Self-pay | Admitting: Orthopedic Surgery

## 2021-05-18 ENCOUNTER — Inpatient Hospital Stay: Admission: RE | Admit: 2021-05-18 | Payer: Medicare Other | Source: Ambulatory Visit

## 2021-06-01 ENCOUNTER — Other Ambulatory Visit: Payer: Self-pay | Admitting: Family Medicine

## 2021-08-25 ENCOUNTER — Ambulatory Visit
Admission: RE | Admit: 2021-08-25 | Discharge: 2021-08-25 | Disposition: A | Discharge status: Home / Self Care | Payer: Medicare Other | Source: Ambulatory Visit | Attending: Family Medicine | Admitting: Family Medicine

## 2021-08-25 DIAGNOSIS — Z780 Asymptomatic menopausal state: Secondary | ICD-10-CM

## 2021-08-26 ENCOUNTER — Other Ambulatory Visit: Payer: Medicare Other

## 2021-08-27 ENCOUNTER — Inpatient Hospital Stay: Admission: RE | Admit: 2021-08-27 | Payer: Medicare Other | Source: Ambulatory Visit

## 2021-09-25 ENCOUNTER — Ambulatory Visit
Admission: RE | Admit: 2021-09-25 | Discharge: 2021-09-25 | Disposition: A | Discharge status: Home / Self Care | Payer: Medicare Other | Source: Ambulatory Visit | Attending: Family Medicine | Admitting: Family Medicine

## 2021-09-25 DIAGNOSIS — Z122 Encounter for screening for malignant neoplasm of respiratory organs: Secondary | ICD-10-CM

## 2021-09-25 DIAGNOSIS — Z87891 Personal history of nicotine dependence: Secondary | ICD-10-CM

## 2021-12-25 ENCOUNTER — Other Ambulatory Visit: Payer: Self-pay | Admitting: Neurosurgery

## 2021-12-31 NOTE — Pre-Procedure Instructions (Signed)
Surgical Instructions    Your procedure is scheduled on Monday September 11.  Report to [facility] Main Entrance "A" at 7:00 A.M., then check in with the Admitting office.  Call this number if you have problems the morning of surgery:  [phone]   If you have any questions prior to your surgery date call [phone]: Open Monday-Friday 8am-4pm    Remember:  Do not eat or drink after midnight the night before your surgery    Take these medicines the morning of surgery with A SIP OF WATER:  amLODipine (NORVASC) buPROPion (WELLBUTRIN SR) cyclobenzaprine (FLEXERIL)  hydrOXYzine (ATARAX) mometasone-formoterol (DULERA) omeprazole (PRILOSEC)   IF NEEDED: acetaminophen (TYLENOL) albuterol (VENTOLIN HFA) methocarbamol (ROBAXIN)  Please bring all inhalers with you the day of surgery.   As of today, STOP taking any Aspirin (unless otherwise instructed by your surgeon) Aleve, Naproxen, Ibuprofen, Motrin, Advil, Goody's, BC's, all herbal medications, fish oil, and all vitamins. THIS INCLUDES meloxicam (MOBIC)  HOW TO MANAGE YOUR DIABETES BEFORE AND AFTER SURGERY  Why is it important to control my blood sugar before and after surgery? Improving blood sugar levels before and after surgery helps healing and can limit problems. A way of improving blood sugar control is eating a healthy diet by:  Eating less sugar and carbohydrates  Increasing activity/exercise  Talking with your doctor about reaching your blood sugar goals High blood sugars (greater than 180 mg/dL) can raise your risk of infections and slow your recovery, so you will need to focus on controlling your diabetes during the weeks before surgery. Make sure that the doctor who takes care of your diabetes knows about your planned surgery including the date and location.  How do I manage my blood sugar before surgery? Check your blood sugar at least 4 times a day, starting 2 days before surgery, to make sure that the level is  not too high or low.  Check your blood sugar the morning of your surgery when you wake up and every 2 hours until you get to the Short Stay unit.  If your blood sugar is less than 70 mg/dL, you will need to treat for low blood sugar: Do not take insulin. Treat a low blood sugar (less than 70 mg/dL) with  cup of clear juice (cranberry or apple), 4 glucose tablets, OR glucose gel. Recheck blood sugar in 15 minutes after treatment (to make sure it is greater than 70 mg/dL). If your blood sugar is not greater than 70 mg/dL on recheck, call [phone] for further instructions. Report your blood sugar to the short stay nurse when you get to Short Stay.  If you are admitted to the hospital after surgery: Your blood sugar will be checked by the staff and you will probably be given insulin after surgery (instead of oral diabetes medicines) to make sure you have good blood sugar levels. The goal for blood sugar control after surgery is 80-180 mg/dL.          Do not wear jewelry or makeup. Do not wear lotions, powders, perfumes or deodorant. Do not shave 48 hours prior to surgery. Do not bring valuables to the hospital. [facility] is not responsible for any belongings or valuables.   Do not wear nail polish, gel polish, artificial nails, or any other type of covering on natural nails (fingers and toes) If you have artificial nails or gel coating that need to be removed by a nail salon, please have this removed prior to surgery. Artificial nails or  gel coating may interfere with anesthesia's ability to adequately monitor your vital signs.   Do NOT Smoke (Tobacco/Vaping)  24 hours prior to your procedure  If you use a CPAP at night, you may bring your mask for your overnight stay.   Contacts, glasses, hearing aids, dentures or partials may not be worn into surgery, please bring cases for these belongings   For patients admitted to the hospital, discharge time will be determined by your treatment  team.   Patients discharged the day of surgery will not be allowed to drive home, and someone needs to stay with them for 24 hours.   SURGICAL WAITING ROOM VISITATION Patients having surgery or a procedure may have no more than 2 support people in the waiting area - these visitors may rotate.   Children under the age of 74 must have an adult with them who is not the patient. If the patient needs to stay at the hospital during part of their recovery, the visitor guidelines for inpatient rooms apply. Pre-op nurse will coordinate an appropriate time for 1 support person to accompany patient in pre-op.  This support person may not rotate.   Please refer to the [facility] website for the visitor guidelines for Inpatients (after your surgery is over and you are in a regular room).    Special instructions:    Oral Hygiene is also important to reduce your risk of infection.  Remember - BRUSH YOUR TEETH THE MORNING OF SURGERY WITH YOUR REGULAR TOOTHPASTE   [place]- Preparing For Surgery  Before surgery, you can play an important role. Because skin is not sterile, your skin needs to be as free of germs as possible. You can reduce the number of germs on your skin by washing with CHG (chlorahexidine gluconate) Soap before surgery.  CHG is an antiseptic cleaner which kills germs and bonds with the skin to continue killing germs even after washing.     Please do not use if you have an allergy to CHG or antibacterial soaps. If your skin becomes reddened/irritated stop using the CHG.  Do not shave (including legs and underarms) for at least 48 hours prior to first CHG shower. It is OK to shave your face.  Please follow these instructions carefully.     Shower the NIGHT BEFORE SURGERY and the MORNING OF SURGERY with CHG Soap.   If you chose to wash your hair, wash your hair first as usual with your normal shampoo. After you shampoo, rinse your hair and body thoroughly to remove the shampoo.  Then  [employer] and genitals (private parts) with your normal soap and rinse thoroughly to remove soap.  After that Use CHG Soap as you would any other liquid soap. You can apply CHG directly to the skin and wash gently with a scrungie or a clean washcloth.   Apply the CHG Soap to your body ONLY FROM THE NECK DOWN.  Do not use on open wounds or open sores. Avoid contact with your eyes, ears, mouth and genitals (private parts). Wash Face and genitals (private parts)  with your normal soap.   Wash thoroughly, paying special attention to the area where your surgery will be performed.  Thoroughly rinse your body with warm water from the neck down.  DO NOT shower/wash with your normal soap after using and rinsing off the CHG Soap.  Pat yourself dry with a CLEAN TOWEL.  Wear CLEAN PAJAMAS to bed the night before surgery  Place CLEAN SHEETS on  your bed the night before your surgery  DO NOT SLEEP WITH PETS.   Day of Surgery:  Take a shower with CHG soap. Wear Clean/Comfortable clothing the morning of surgery Do not apply any deodorants/lotions.   Remember to brush your teeth WITH YOUR REGULAR TOOTHPASTE.    If you received a COVID test during your pre-op visit, it is requested that you wear a mask when out in public, stay away from anyone that may not be feeling well, and notify your surgeon if you develop symptoms. If you have been in contact with anyone that has tested positive in the last 10 days, please notify your surgeon.    Please read over the following fact sheets that you were given.

## 2021-12-31 NOTE — Pre-Procedure Instructions (Signed)
Surgical Instructions    Your procedure is scheduled on Monday September 11.  Report to [facility] Main Entrance "A" at 7:00 A.M., then check in with the Admitting office.  Call this number if you have problems the morning of surgery:  [phone]   If you have any questions prior to your surgery date call [phone]: Open Monday-Friday 8am-4pm    Remember:  Do not eat or drink after midnight the night before your surgery    Take these medicines the morning of surgery with A SIP OF WATER:  amLODipine (NORVASC) buPROPion (WELLBUTRIN SR) cyclobenzaprine (FLEXERIL)  hydrOXYzine (ATARAX) mometasone-formoterol (DULERA) omeprazole (PRILOSEC)   IF NEEDED: acetaminophen (TYLENOL) albuterol (VENTOLIN HFA) methocarbamol (ROBAXIN)  Please bring all inhalers with you the day of surgery.   As of today, STOP taking any Aspirin (unless otherwise instructed by your surgeon) Aleve, Naproxen, Ibuprofen, Motrin, Advil, Goody's, BC's, all herbal medications, fish oil, and all vitamins. THIS INCLUDES meloxicam (MOBIC)           Do not wear jewelry or makeup. Do not wear lotions, powders, perfumes or deodorant. Do not shave 48 hours prior to surgery. Do not bring valuables to the hospital. [facility] is not responsible for any belongings or valuables.   Do not wear nail polish, gel polish, artificial nails, or any other type of covering on natural nails (fingers and toes) If you have artificial nails or gel coating that need to be removed by a nail salon, please have this removed prior to surgery. Artificial nails or gel coating may interfere with anesthesia's ability to adequately monitor your vital signs.   Do NOT Smoke (Tobacco/Vaping)  24 hours prior to your procedure  If you use a CPAP at night, you may bring your mask for your overnight stay.   Contacts, glasses, hearing aids, dentures or partials may not be worn into surgery, please bring cases for these belongings   For patients  admitted to the hospital, discharge time will be determined by your treatment team.   Patients discharged the day of surgery will not be allowed to drive home, and someone needs to stay with them for 24 hours.   SURGICAL WAITING ROOM VISITATION Patients having surgery or a procedure may have no more than 2 support people in the waiting area - these visitors may rotate.   Children under the age of 2 must have an adult with them who is not the patient. If the patient needs to stay at the hospital during part of their recovery, the visitor guidelines for inpatient rooms apply. Pre-op nurse will coordinate an appropriate time for 1 support person to accompany patient in pre-op.  This support person may not rotate.   Please refer to the [facility] website for the visitor guidelines for Inpatients (after your surgery is over and you are in a regular room).    Special instructions:    Oral Hygiene is also important to reduce your risk of infection.  Remember - BRUSH YOUR TEETH THE MORNING OF SURGERY WITH YOUR REGULAR TOOTHPASTE   [place]- Preparing For Surgery  Before surgery, you can play an important role. Because skin is not sterile, your skin needs to be as free of germs as possible. You can reduce the number of germs on your skin by washing with CHG (chlorahexidine gluconate) Soap before surgery.  CHG is an antiseptic cleaner which kills germs and bonds with the skin to continue killing germs even after washing.     Please do  not use if you have an allergy to CHG or antibacterial soaps. If your skin becomes reddened/irritated stop using the CHG.  Do not shave (including legs and underarms) for at least 48 hours prior to first CHG shower. It is OK to shave your face.  Please follow these instructions carefully.     Shower the NIGHT BEFORE SURGERY and the MORNING OF SURGERY with CHG Soap.   If you chose to wash your hair, wash your hair first as usual with your normal shampoo. After  you shampoo, rinse your hair and body thoroughly to remove the shampoo.  Then [employer] and genitals (private parts) with your normal soap and rinse thoroughly to remove soap.  After that Use CHG Soap as you would any other liquid soap. You can apply CHG directly to the skin and wash gently with a scrungie or a clean washcloth.   Apply the CHG Soap to your body ONLY FROM THE NECK DOWN.  Do not use on open wounds or open sores. Avoid contact with your eyes, ears, mouth and genitals (private parts). Wash Face and genitals (private parts)  with your normal soap.   Wash thoroughly, paying special attention to the area where your surgery will be performed.  Thoroughly rinse your body with warm water from the neck down.  DO NOT shower/wash with your normal soap after using and rinsing off the CHG Soap.  Pat yourself dry with a CLEAN TOWEL.  Wear CLEAN PAJAMAS to bed the night before surgery  Place CLEAN SHEETS on your bed the night before your surgery  DO NOT SLEEP WITH PETS.   Day of Surgery:  Take a shower with CHG soap. Wear Clean/Comfortable clothing the morning of surgery Do not apply any deodorants/lotions.   Remember to brush your teeth WITH YOUR REGULAR TOOTHPASTE.    If you received a COVID test during your pre-op visit, it is requested that you wear a mask when out in public, stay away from anyone that may not be feeling well, and notify your surgeon if you develop symptoms. If you have been in contact with anyone that has tested positive in the last 10 days, please notify your surgeon.    Please read over the following fact sheets that you were given.

## 2022-01-01 ENCOUNTER — Encounter (HOSPITAL_COMMUNITY): Payer: Self-pay

## 2022-01-01 ENCOUNTER — Other Ambulatory Visit: Payer: Self-pay

## 2022-01-01 ENCOUNTER — Encounter (HOSPITAL_COMMUNITY)
Admission: RE | Admit: 2022-01-01 | Discharge: 2022-01-01 | Disposition: A | Discharge status: Home / Self Care | Payer: Medicare Other | Source: Ambulatory Visit | Attending: Neurosurgery | Admitting: Neurosurgery

## 2022-01-01 VITALS — BP 151/87 | HR 98 | Temp 98.3°F | Resp 19 | Ht 66.5 in | Wt 267.9 lb

## 2022-01-01 DIAGNOSIS — Z01818 Encounter for other preprocedural examination: Secondary | ICD-10-CM | POA: Insufficient documentation

## 2022-01-01 DIAGNOSIS — K760 Fatty (change of) liver, not elsewhere classified: Secondary | ICD-10-CM

## 2022-01-01 HISTORY — DX: Fatty (change of) liver, not elsewhere classified: K76.0

## 2022-01-01 HISTORY — DX: Personal history of other diseases of the digestive system: Z87.19

## 2022-01-01 LAB — CBC
HCT: 43.3 % (ref 36.0–46.0)
Hemoglobin: 14.3 g/dL (ref 12.0–15.0)
MCH: 30.1 pg (ref 26.0–34.0)
MCHC: 33 g/dL (ref 30.0–36.0)
MCV: 91.2 fL (ref 80.0–100.0)
Platelets: 178 10*3/uL (ref 150–400)
RBC: 4.75 MIL/uL (ref 3.87–5.11)
RDW: 12.8 % (ref 11.5–15.5)
WBC: 6.1 10*3/uL (ref 4.0–10.5)
nRBC: 0 % (ref 0.0–0.2)

## 2022-01-01 LAB — COMPREHENSIVE METABOLIC PANEL
ALT: 17 U/L (ref 0–44)
AST: 17 U/L (ref 15–41)
Albumin: 4 g/dL (ref 3.5–5.0)
Alkaline Phosphatase: 85 U/L (ref 38–126)
Anion gap: 7 (ref 5–15)
BUN: 14 mg/dL (ref 8–23)
CO2: 28 mmol/L (ref 22–32)
Calcium: 9.9 mg/dL (ref 8.9–10.3)
Chloride: 102 mmol/L (ref 98–111)
Creatinine, Ser: 1.03 mg/dL — ABNORMAL HIGH (ref 0.44–1.00)
GFR, Estimated: >60 mL/min (ref >60)
Glucose, Bld: 136 mg/dL — ABNORMAL HIGH (ref 70–99)
Potassium: 4.6 mmol/L (ref 3.5–5.1)
Sodium: 137 mmol/L (ref 135–145)
Total Bilirubin: 0.5 mg/dL (ref 0.3–1.2)
Total Protein: 7.5 g/dL (ref 6.5–8.1)

## 2022-01-01 LAB — SURGICAL PCR SCREEN
MRSA, PCR: NEGATIVE
Staphylococcus aureus: POSITIVE — AB

## 2022-01-01 LAB — TYPE AND SCREEN
ABO/RH(D): A POS
Antibody Screen: NEGATIVE

## 2022-01-01 NOTE — Progress Notes (Signed)
PCP - Cletis Media, PA-C Cardiologist - denies  PPM/ICD - denies   Chest x-ray - 12/29/16 EKG - 05/05/21 Stress Test - denies ECHO - denies Cardiac Cath - denies  Sleep Study - 2022, OSA+ CPAP - nightly  DM- denies  Blood Thinner Instructions: n/a Aspirin Instructions: hold 7 days prior  ERAS Protcol - no, NPO   COVID TEST- n/a   Anesthesia review: no  Patient denies shortness of breath, fever, cough and chest pain at PAT appointment   All instructions explained to the patient, with a verbal understanding of the material. Patient agrees to go over the instructions while at home for a better understanding.  The opportunity to ask questions was provided.

## 2022-01-04 ENCOUNTER — Observation Stay (HOSPITAL_COMMUNITY)
Admission: RE | Admit: 2022-01-04 | Discharge: 2022-01-05 | Disposition: A | Discharge status: Home / Self Care | Payer: Medicare Other | Source: Ambulatory Visit | Attending: Neurosurgery | Admitting: Neurosurgery

## 2022-01-04 ENCOUNTER — Other Ambulatory Visit: Payer: Self-pay

## 2022-01-04 ENCOUNTER — Ambulatory Visit (HOSPITAL_COMMUNITY): Payer: Medicare Other

## 2022-01-04 ENCOUNTER — Ambulatory Visit (HOSPITAL_COMMUNITY): Payer: Medicare Other | Admitting: Anesthesiology

## 2022-01-04 ENCOUNTER — Ambulatory Visit (HOSPITAL_BASED_OUTPATIENT_CLINIC_OR_DEPARTMENT_OTHER): Payer: Medicare Other | Admitting: Anesthesiology

## 2022-01-04 ENCOUNTER — Encounter (HOSPITAL_COMMUNITY)
Admission: RE | Disposition: A | Discharge status: Home / Self Care | Payer: Self-pay | Source: Ambulatory Visit | Attending: Neurosurgery

## 2022-01-04 ENCOUNTER — Encounter (HOSPITAL_COMMUNITY): Payer: Self-pay | Admitting: Neurosurgery

## 2022-01-04 DIAGNOSIS — M4317 Spondylolisthesis, lumbosacral region: Secondary | ICD-10-CM | POA: Diagnosis present

## 2022-01-04 DIAGNOSIS — I10 Essential (primary) hypertension: Secondary | ICD-10-CM | POA: Diagnosis not present

## 2022-01-04 DIAGNOSIS — Z7982 Long term (current) use of aspirin: Secondary | ICD-10-CM | POA: Diagnosis not present

## 2022-01-04 DIAGNOSIS — M4316 Spondylolisthesis, lumbar region: Secondary | ICD-10-CM | POA: Diagnosis not present

## 2022-01-04 DIAGNOSIS — M5117 Intervertebral disc disorders with radiculopathy, lumbosacral region: Secondary | ICD-10-CM | POA: Insufficient documentation

## 2022-01-04 DIAGNOSIS — F17210 Nicotine dependence, cigarettes, uncomplicated: Secondary | ICD-10-CM | POA: Insufficient documentation

## 2022-01-04 DIAGNOSIS — M4310 Spondylolisthesis, site unspecified: Secondary | ICD-10-CM | POA: Diagnosis present

## 2022-01-04 DIAGNOSIS — M4807 Spinal stenosis, lumbosacral region: Secondary | ICD-10-CM | POA: Insufficient documentation

## 2022-01-04 DIAGNOSIS — Z79899 Other long term (current) drug therapy: Secondary | ICD-10-CM | POA: Diagnosis not present

## 2022-01-04 DIAGNOSIS — Z96651 Presence of right artificial knee joint: Secondary | ICD-10-CM | POA: Diagnosis not present

## 2022-01-04 LAB — GLUCOSE, CAPILLARY: Glucose-Capillary: 172 mg/dL — ABNORMAL HIGH (ref 70–99)

## 2022-01-04 LAB — ABO/RH: ABO/RH(D): A POS

## 2022-01-04 SURGERY — POSTERIOR LUMBAR FUSION 2 LEVEL
Anesthesia: General | Site: Back

## 2022-01-04 MED ORDER — PROMETHAZINE HCL 25 MG/ML IJ SOLN: 6.2500 mg | INTRAMUSCULAR | Status: DC | PRN

## 2022-01-04 MED ORDER — ATORVASTATIN CALCIUM 10 MG PO TABS
20.0000 mg | ORAL_TABLET | Freq: Every day | ORAL | Status: DC
  Administered 2022-01-04: 20 mg via ORAL
  Filled 2022-01-04: qty 2

## 2022-01-04 MED ORDER — CEFAZOLIN SODIUM 1 G IJ SOLR
INTRAMUSCULAR | Status: AC
  Filled 2022-01-04: qty 10

## 2022-01-04 MED ORDER — OXYCODONE HCL 5 MG PO TABS
ORAL_TABLET | ORAL | Status: AC
  Filled 2022-01-04: qty 1

## 2022-01-04 MED ORDER — PRIMIDONE 50 MG PO TABS
100.0000 mg | ORAL_TABLET | Freq: Every day | ORAL | Status: DC
  Administered 2022-01-04: 100 mg via ORAL
  Filled 2022-01-04: qty 2

## 2022-01-04 MED ORDER — KETAMINE HCL 10 MG/ML IJ SOLN
INTRAMUSCULAR | Status: DC | PRN
  Administered 2022-01-04: 40 mg via INTRAVENOUS
  Administered 2022-01-04: 10 mg via INTRAVENOUS

## 2022-01-04 MED ORDER — MIDAZOLAM HCL 2 MG/2ML IJ SOLN
INTRAMUSCULAR | Status: DC | PRN
  Administered 2022-01-04: 2 mg via INTRAVENOUS

## 2022-01-04 MED ORDER — HYDROXYZINE HCL 25 MG PO TABS: 25.0000 mg | ORAL_TABLET | ORAL | Status: DC

## 2022-01-04 MED ORDER — OMEGA-3-ACID ETHYL ESTERS 1 G PO CAPS
1000.0000 mg | ORAL_CAPSULE | Freq: Every day | ORAL | Status: DC
  Administered 2022-01-05: 1000 mg via ORAL
  Filled 2022-01-04: qty 1

## 2022-01-04 MED ORDER — DEXMEDETOMIDINE HCL IN NACL 80 MCG/20ML IV SOLN
INTRAVENOUS | Status: AC
  Filled 2022-01-04: qty 20

## 2022-01-04 MED ORDER — 0.9 % SODIUM CHLORIDE (POUR BTL) OPTIME
TOPICAL | Status: DC | PRN
  Administered 2022-01-04: 1000 mL

## 2022-01-04 MED ORDER — HYDROXYZINE HCL 25 MG PO TABS
25.0000 mg | ORAL_TABLET | Freq: Every day | ORAL | Status: DC
  Administered 2022-01-05: 25 mg via ORAL
  Filled 2022-01-04: qty 1

## 2022-01-04 MED ORDER — CHLORHEXIDINE GLUCONATE 0.12 % MT SOLN: 15.0000 mL | Freq: Once | OROMUCOSAL | Status: AC

## 2022-01-04 MED ORDER — PHENOL 1.4 % MT LIQD: 1.0000 | OROMUCOSAL | Status: DC | PRN

## 2022-01-04 MED ORDER — BUPIVACAINE HCL (PF) 0.25 % IJ SOLN
INTRAMUSCULAR | Status: DC | PRN
  Administered 2022-01-04: 20 mL

## 2022-01-04 MED ORDER — ACETAMINOPHEN 500 MG PO TABS
1000.0000 mg | ORAL_TABLET | Freq: Once | ORAL | Status: AC
Start: 2022-01-04 — End: 2022-01-04
  Administered 2022-01-04: 1000 mg via ORAL
  Filled 2022-01-04: qty 2

## 2022-01-04 MED ORDER — FENTANYL CITRATE (PF) 250 MCG/5ML IJ SOLN
INTRAMUSCULAR | Status: AC
  Filled 2022-01-04: qty 5

## 2022-01-04 MED ORDER — DEXAMETHASONE SODIUM PHOSPHATE 10 MG/ML IJ SOLN
INTRAMUSCULAR | Status: DC | PRN
  Administered 2022-01-04: 10 mg via INTRAVENOUS

## 2022-01-04 MED ORDER — OXYCODONE HCL 5 MG PO TABS
10.0000 mg | ORAL_TABLET | ORAL | Status: DC | PRN
  Administered 2022-01-04 – 2022-01-05 (×3): 10 mg via ORAL
  Filled 2022-01-04 (×3): qty 2

## 2022-01-04 MED ORDER — SUGAMMADEX SODIUM 200 MG/2ML IV SOLN
INTRAVENOUS | Status: DC | PRN
  Administered 2022-01-04: 400 mg via INTRAVENOUS

## 2022-01-04 MED ORDER — ONDANSETRON HCL 4 MG/2ML IJ SOLN: 4.0000 mg | Freq: Four times a day (QID) | INTRAMUSCULAR | Status: DC | PRN

## 2022-01-04 MED ORDER — FENTANYL CITRATE (PF) 100 MCG/2ML IJ SOLN
INTRAMUSCULAR | Status: AC
  Filled 2022-01-04: qty 2

## 2022-01-04 MED ORDER — VANCOMYCIN HCL 1000 MG IV SOLR
INTRAVENOUS | Status: AC
  Filled 2022-01-04: qty 20

## 2022-01-04 MED ORDER — ALBUTEROL SULFATE (2.5 MG/3ML) 0.083% IN NEBU: 3.0000 mL | INHALATION_SOLUTION | Freq: Four times a day (QID) | RESPIRATORY_TRACT | Status: DC | PRN

## 2022-01-04 MED ORDER — HYDROMORPHONE HCL 1 MG/ML IJ SOLN
INTRAMUSCULAR | Status: DC | PRN
  Administered 2022-01-04: .5 mg via INTRAVENOUS

## 2022-01-04 MED ORDER — ONDANSETRON HCL 4 MG/2ML IJ SOLN
INTRAMUSCULAR | Status: AC
  Filled 2022-01-04: qty 2

## 2022-01-04 MED ORDER — BISACODYL 10 MG RE SUPP: 10.0000 mg | Freq: Every day | RECTAL | Status: DC | PRN

## 2022-01-04 MED ORDER — ADULT MULTIVITAMIN W/MINERALS CH
1.0000 | ORAL_TABLET | Freq: Every day | ORAL | Status: DC
  Administered 2022-01-05: 1 via ORAL
  Filled 2022-01-04: qty 1

## 2022-01-04 MED ORDER — ROCURONIUM BROMIDE 10 MG/ML (PF) SYRINGE
PREFILLED_SYRINGE | INTRAVENOUS | Status: AC
  Filled 2022-01-04: qty 20

## 2022-01-04 MED ORDER — PROPOFOL 10 MG/ML IV BOLUS
INTRAVENOUS | Status: AC
  Filled 2022-01-04: qty 20

## 2022-01-04 MED ORDER — DEXAMETHASONE SODIUM PHOSPHATE 10 MG/ML IJ SOLN
INTRAMUSCULAR | Status: AC
  Filled 2022-01-04: qty 1

## 2022-01-04 MED ORDER — LACTATED RINGERS IV SOLN: INTRAVENOUS | Status: DC

## 2022-01-04 MED ORDER — VITAMIN D 25 MCG (1000 UNIT) PO TABS
1000.0000 [IU] | ORAL_TABLET | Freq: Every day | ORAL | Status: DC
  Administered 2022-01-05: 1000 [IU] via ORAL
  Filled 2022-01-04: qty 1

## 2022-01-04 MED ORDER — HYDROMORPHONE HCL 1 MG/ML IJ SOLN
1.0000 mg | INTRAMUSCULAR | Status: DC | PRN
  Administered 2022-01-05: 1 mg via INTRAVENOUS
  Filled 2022-01-04: qty 1

## 2022-01-04 MED ORDER — BUPROPION HCL ER (SR) 150 MG PO TB12
150.0000 mg | ORAL_TABLET | Freq: Two times a day (BID) | ORAL | Status: DC
  Administered 2022-01-04 – 2022-01-05 (×2): 150 mg via ORAL
  Filled 2022-01-04 (×2): qty 1

## 2022-01-04 MED ORDER — PANTOPRAZOLE SODIUM 40 MG PO TBEC
40.0000 mg | DELAYED_RELEASE_TABLET | Freq: Every day | ORAL | Status: DC
  Administered 2022-01-05: 40 mg via ORAL
  Filled 2022-01-04: qty 1

## 2022-01-04 MED ORDER — SODIUM CHLORIDE 0.9% FLUSH: 3.0000 mL | INTRAVENOUS | Status: DC | PRN

## 2022-01-04 MED ORDER — BUPIVACAINE HCL (PF) 0.25 % IJ SOLN
INTRAMUSCULAR | Status: AC
  Filled 2022-01-04: qty 30

## 2022-01-04 MED ORDER — POLYETHYLENE GLYCOL 3350 17 G PO PACK: 17.0000 g | PACK | Freq: Every day | ORAL | Status: DC | PRN

## 2022-01-04 MED ORDER — KETAMINE HCL 50 MG/5ML IJ SOSY
PREFILLED_SYRINGE | INTRAMUSCULAR | Status: AC
  Filled 2022-01-04: qty 5

## 2022-01-04 MED ORDER — ACETAMINOPHEN 325 MG PO TABS
650.0000 mg | ORAL_TABLET | ORAL | Status: DC | PRN
  Administered 2022-01-05: 650 mg via ORAL
  Filled 2022-01-04: qty 2

## 2022-01-04 MED ORDER — DEXAMETHASONE SODIUM PHOSPHATE 10 MG/ML IJ SOLN
INTRAMUSCULAR | Status: AC
Start: 2022-01-04 — End: ?
  Filled 2022-01-04: qty 1

## 2022-01-04 MED ORDER — AMISULPRIDE (ANTIEMETIC) 5 MG/2ML IV SOLN
10.0000 mg | Freq: Once | INTRAVENOUS | Status: DC | PRN
Start: 2022-01-04 — End: 2022-01-04

## 2022-01-04 MED ORDER — MIDAZOLAM HCL 2 MG/2ML IJ SOLN
INTRAMUSCULAR | Status: AC
  Filled 2022-01-04: qty 2

## 2022-01-04 MED ORDER — DIAZEPAM 5 MG PO TABS
5.0000 mg | ORAL_TABLET | Freq: Four times a day (QID) | ORAL | Status: DC | PRN
  Administered 2022-01-04: 5 mg via ORAL
  Administered 2022-01-05: 10 mg via ORAL
  Filled 2022-01-04: qty 2
  Filled 2022-01-04: qty 1

## 2022-01-04 MED ORDER — PROPOFOL 10 MG/ML IV BOLUS
INTRAVENOUS | Status: DC | PRN
  Administered 2022-01-04: 50 mg via INTRAVENOUS
  Administered 2022-01-04: 100 mg via INTRAVENOUS

## 2022-01-04 MED ORDER — FENTANYL CITRATE (PF) 250 MCG/5ML IJ SOLN
INTRAMUSCULAR | Status: DC | PRN
  Administered 2022-01-04: 150 ug via INTRAVENOUS
  Administered 2022-01-04 (×2): 100 ug via INTRAVENOUS
  Administered 2022-01-04: 25 ug via INTRAVENOUS
  Administered 2022-01-04 (×2): 50 ug via INTRAVENOUS
  Administered 2022-01-04: 25 ug via INTRAVENOUS

## 2022-01-04 MED ORDER — CEFAZOLIN SODIUM-DEXTROSE 1-4 GM/50ML-% IV SOLN
1.0000 g | Freq: Three times a day (TID) | INTRAVENOUS | Status: AC
  Administered 2022-01-04 – 2022-01-05 (×2): 1 g via INTRAVENOUS
  Filled 2022-01-04 (×2): qty 50

## 2022-01-04 MED ORDER — ROCURONIUM BROMIDE 10 MG/ML (PF) SYRINGE
PREFILLED_SYRINGE | INTRAVENOUS | Status: DC | PRN
  Administered 2022-01-04: 20 mg via INTRAVENOUS
  Administered 2022-01-04: 30 mg via INTRAVENOUS
  Administered 2022-01-04: 60 mg via INTRAVENOUS
  Administered 2022-01-04: 20 mg via INTRAVENOUS
  Administered 2022-01-04: 40 mg via INTRAVENOUS
  Administered 2022-01-04: 20 mg via INTRAVENOUS
  Administered 2022-01-04: 30 mg via INTRAVENOUS

## 2022-01-04 MED ORDER — CHLORHEXIDINE GLUCONATE CLOTH 2 % EX PADS: 6.0000 | MEDICATED_PAD | Freq: Once | CUTANEOUS | Status: DC

## 2022-01-04 MED ORDER — FLEET ENEMA 7-19 GM/118ML RE ENEM: 1.0000 | ENEMA | Freq: Once | RECTAL | Status: DC | PRN

## 2022-01-04 MED ORDER — MENTHOL 3 MG MT LOZG: 1.0000 | LOZENGE | OROMUCOSAL | Status: DC | PRN

## 2022-01-04 MED ORDER — OXYCODONE HCL 5 MG/5ML PO SOLN: 5.0000 mg | Freq: Once | ORAL | Status: AC | PRN

## 2022-01-04 MED ORDER — HYDROXYZINE HCL 25 MG PO TABS
50.0000 mg | ORAL_TABLET | Freq: Every day | ORAL | Status: DC
  Administered 2022-01-04: 50 mg via ORAL
  Filled 2022-01-04: qty 2

## 2022-01-04 MED ORDER — MOMETASONE FURO-FORMOTEROL FUM 200-5 MCG/ACT IN AERO
2.0000 | INHALATION_SPRAY | Freq: Two times a day (BID) | RESPIRATORY_TRACT | Status: DC
  Filled 2022-01-04: qty 8.8

## 2022-01-04 MED ORDER — VANCOMYCIN HCL 1000 MG IV SOLR
INTRAVENOUS | Status: DC | PRN
  Administered 2022-01-04: 1000 mg

## 2022-01-04 MED ORDER — HYDROMORPHONE HCL 1 MG/ML IJ SOLN
INTRAMUSCULAR | Status: AC
  Filled 2022-01-04: qty 0.5

## 2022-01-04 MED ORDER — OXYCODONE HCL 5 MG PO TABS
5.0000 mg | ORAL_TABLET | Freq: Once | ORAL | Status: AC | PRN
  Administered 2022-01-04: 5 mg via ORAL

## 2022-01-04 MED ORDER — LIDOCAINE 2% (20 MG/ML) 5 ML SYRINGE
INTRAMUSCULAR | Status: DC | PRN
  Administered 2022-01-04: 100 mg via INTRAVENOUS

## 2022-01-04 MED ORDER — AMLODIPINE BESYLATE 5 MG PO TABS
10.0000 mg | ORAL_TABLET | Freq: Every day | ORAL | Status: DC
  Administered 2022-01-05: 10 mg via ORAL
  Filled 2022-01-04: qty 2

## 2022-01-04 MED ORDER — THROMBIN 20000 UNITS EX SOLR
CUTANEOUS | Status: AC
  Filled 2022-01-04: qty 20000

## 2022-01-04 MED ORDER — FENTANYL CITRATE (PF) 100 MCG/2ML IJ SOLN
25.0000 ug | INTRAMUSCULAR | Status: DC | PRN
  Administered 2022-01-04 (×2): 50 ug via INTRAVENOUS

## 2022-01-04 MED ORDER — ACETAMINOPHEN 650 MG RE SUPP: 650.0000 mg | RECTAL | Status: DC | PRN

## 2022-01-04 MED ORDER — CEFAZOLIN IN SODIUM CHLORIDE 3-0.9 GM/100ML-% IV SOLN
3.0000 g | INTRAVENOUS | Status: AC
  Administered 2022-01-04: 3 g via INTRAVENOUS
  Filled 2022-01-04: qty 100

## 2022-01-04 MED ORDER — ONDANSETRON HCL 4 MG PO TABS: 4.0000 mg | ORAL_TABLET | Freq: Four times a day (QID) | ORAL | Status: DC | PRN

## 2022-01-04 MED ORDER — ORAL CARE MOUTH RINSE
15.0000 mL | Freq: Once | OROMUCOSAL | Status: AC
  Administered 2022-01-04: 15 mL via OROMUCOSAL

## 2022-01-04 MED ORDER — DEXMEDETOMIDINE (PRECEDEX) IN NS 20 MCG/5ML (4 MCG/ML) IV SYRINGE
PREFILLED_SYRINGE | INTRAVENOUS | Status: DC | PRN
  Administered 2022-01-04: 8 ug via INTRAVENOUS
  Administered 2022-01-04: 20 ug via INTRAVENOUS
  Administered 2022-01-04: 12 ug via INTRAVENOUS

## 2022-01-04 MED ORDER — LIDOCAINE 2% (20 MG/ML) 5 ML SYRINGE
INTRAMUSCULAR | Status: AC
  Filled 2022-01-04: qty 5

## 2022-01-04 MED ORDER — THROMBIN 20000 UNITS EX SOLR
CUTANEOUS | Status: DC | PRN
  Administered 2022-01-04: 20 mL via TOPICAL

## 2022-01-04 MED ORDER — SODIUM CHLORIDE 0.9% FLUSH
3.0000 mL | Freq: Two times a day (BID) | INTRAVENOUS | Status: DC
  Administered 2022-01-04 (×2): 3 mL via INTRAVENOUS

## 2022-01-04 MED ORDER — ONDANSETRON HCL 4 MG/2ML IJ SOLN
INTRAMUSCULAR | Status: DC | PRN
  Administered 2022-01-04: 4 mg via INTRAVENOUS

## 2022-01-04 MED ORDER — HYDROCODONE-ACETAMINOPHEN 10-325 MG PO TABS
1.0000 | ORAL_TABLET | ORAL | Status: DC | PRN
  Administered 2022-01-04: 1 via ORAL
  Filled 2022-01-04: qty 1

## 2022-01-04 MED ORDER — PHENTERMINE HCL 37.5 MG PO TABS: 37.5000 mg | ORAL_TABLET | Freq: Every day | ORAL | Status: DC

## 2022-01-04 MED ORDER — SODIUM CHLORIDE 0.9 % IV SOLN
250.0000 mL | INTRAVENOUS | Status: DC
  Administered 2022-01-04: 250 mL via INTRAVENOUS

## 2022-01-04 SURGICAL SUPPLY — 67 items
BAG COUNTER SPONGE SURGICOUNT (BAG) ×1 IMPLANT
BAG DECANTER FOR FLEXI CONT (MISCELLANEOUS) ×1 IMPLANT
BENZOIN TINCTURE AMPULE (MISCELLANEOUS) IMPLANT
BENZOIN TINCTURE PRP APPL 2/3 (GAUZE/BANDAGES/DRESSINGS) ×1 IMPLANT
BLADE BONE MILL MEDIUM (MISCELLANEOUS) ×1 IMPLANT
BLADE CLIPPER SURG (BLADE) IMPLANT
BUR CUTTER 7.0 ROUND (BURR) IMPLANT
BUR MATCHSTICK NEURO 3.0 LAGG (BURR) ×1 IMPLANT
CAGE EXP CATALYFT 9 (Plate) IMPLANT
CANISTER SUCT 3000ML PPV (MISCELLANEOUS) ×1 IMPLANT
CAP LCK SPNE (Orthopedic Implant) ×6 IMPLANT
CAP LOCK SPINE RADIUS (Orthopedic Implant) IMPLANT
CAP LOCKING (Orthopedic Implant) ×6 IMPLANT
CARTRIDGE OIL MAESTRO DRILL (MISCELLANEOUS) ×1 IMPLANT
CLSR STERI-STRIP ANTIMIC 1/2X4 (GAUZE/BANDAGES/DRESSINGS) IMPLANT
CNTNR URN SCR LID CUP LEK RST (MISCELLANEOUS) ×1 IMPLANT
CONT SPEC 4OZ STRL OR WHT (MISCELLANEOUS) ×1
COVER BACK TABLE 60X90IN (DRAPES) ×1 IMPLANT
DERMABOND IMPLANT
DERMABOND ADVANCED .7 DNX12 (GAUZE/BANDAGES/DRESSINGS) ×1 IMPLANT
DIFFUSER DRILL AIR PNEUMATIC (MISCELLANEOUS) ×1 IMPLANT
DRAPE C-ARM 42X72 X-RAY (DRAPES) ×2 IMPLANT
DRAPE HALF SHEET 40X57 (DRAPES) IMPLANT
DRAPE LAPAROTOMY 100X72X124 (DRAPES) ×1 IMPLANT
DRAPE SURG 17X23 STRL (DRAPES) ×4 IMPLANT
DRSG OPSITE POSTOP 4X6 (GAUZE/BANDAGES/DRESSINGS) ×1 IMPLANT
DRSG OPSITE POSTOP 4X8 (GAUZE/BANDAGES/DRESSINGS) IMPLANT
DURAPREP 26ML APPLICATOR (WOUND CARE) ×1 IMPLANT
ELECT REM PT RETURN 9FT ADLT (ELECTROSURGICAL) ×1
ELECTRODE REM PT RTRN 9FT ADLT (ELECTROSURGICAL) ×1 IMPLANT
EVACUATOR 1/8 PVC DRAIN (DRAIN) IMPLANT
GAUZE 4X4 16PLY ~~LOC~~+RFID DBL (SPONGE) IMPLANT
GAUZE SPONGE 4X4 12PLY STRL (GAUZE/BANDAGES/DRESSINGS) IMPLANT
GAUZE SPONGE 4X4 16PLY XRAY LF (GAUZE/BANDAGES/DRESSINGS) IMPLANT
GLOVE BIO SURGEON STRL SZ 6.5 (GLOVE) ×1 IMPLANT
GLOVE BIOGEL PI IND STRL 6.5 (GLOVE) ×1 IMPLANT
GLOVE ECLIPSE 9.0 STRL (GLOVE) ×2 IMPLANT
GOWN STRL REUS W/ TWL LRG LVL3 (GOWN DISPOSABLE) IMPLANT
GOWN STRL REUS W/ TWL XL LVL3 (GOWN DISPOSABLE) ×2 IMPLANT
GOWN STRL REUS W/TWL 2XL LVL3 (GOWN DISPOSABLE) IMPLANT
GOWN STRL REUS W/TWL LRG LVL3 (GOWN DISPOSABLE) ×2
GOWN STRL REUS W/TWL XL LVL3 (GOWN DISPOSABLE) ×4
KIT BASIN OR (CUSTOM PROCEDURE TRAY) ×1 IMPLANT
KIT TURNOVER KIT B (KITS) ×1 IMPLANT
MILL MEDIUM DISP (BLADE) ×1 IMPLANT
NEEDLE HYPO 22GX1.5 SAFETY (NEEDLE) ×1 IMPLANT
NS IRRIG 1000ML POUR BTL (IV SOLUTION) ×1 IMPLANT
OIL CARTRIDGE MAESTRO DRILL (MISCELLANEOUS) ×1
PACK LAMINECTOMY NEURO (CUSTOM PROCEDURE TRAY) ×1 IMPLANT
PUTTY GRAFTON DBF 6CC W/DELIVE (Putty) IMPLANT
ROD 70MM (Rod) ×2 IMPLANT
ROD SPNL 70X5.5XNS TI RDS (Rod) IMPLANT
SCREW 5.75X40M (Screw) IMPLANT
SCREW 5.75X45MM (Screw) IMPLANT
SPACER PL CATALYFT LONG 11 (Spacer) IMPLANT
SPIKE FLUID TRANSFER (MISCELLANEOUS) ×1 IMPLANT
SPONGE SURGIFOAM ABS GEL 100 (HEMOSTASIS) ×1 IMPLANT
STRIP CLOSURE SKIN 1/2X4 (GAUZE/BANDAGES/DRESSINGS) ×2 IMPLANT
SUT VIC AB 0 CT1 18XCR BRD8 (SUTURE) ×2 IMPLANT
SUT VIC AB 0 CT1 8-18 (SUTURE) ×2
SUT VIC AB 2-0 CT1 18 (SUTURE) ×1 IMPLANT
SUT VIC AB 3-0 SH 8-18 (SUTURE) ×2 IMPLANT
TOWEL GREEN STERILE (TOWEL DISPOSABLE) ×1 IMPLANT
TOWEL GREEN STERILE FF (TOWEL DISPOSABLE) ×1 IMPLANT
TRAY FOLEY MTR SLVR 16FR STAT (SET/KITS/TRAYS/PACK) ×1 IMPLANT
TRAY FOLEY SLVR 14FR TEMP STAT (SET/KITS/TRAYS/PACK) IMPLANT
WATER STERILE IRR 1000ML POUR (IV SOLUTION) ×1 IMPLANT

## 2022-01-04 NOTE — H&P (Signed)
Brittney Hunt is an 61 y.o. female.   Chief Complaint: Back pain HPI: 61 year old female with chronic and progressively worsening lower back pain with radiation to both lower extremities left greater than right.  Work-up demonstrates evidence of a progressively worsening L4-5 degenerative spondylolisthesis with associated stenosis and angulation at L4-5.  Patient also with marked disc degeneration and severe foraminal stenosis at L5-S1 on the left.  Patient presents now for L4-5 and L5-S1 decompression and fusion in hopes of improving her symptoms.  Past Medical History:  Diagnosis Date   Anxiety    Arthritis    Depression    Diabetes mellitus without complication (HCC)    pt denies this, no medication, A1C normal   Fatty liver    GERD (gastroesophageal reflux disease)    History of hiatal hernia    Hypertension    Pneumonia    Sleep apnea    Tremor     Past Surgical History:  Procedure Laterality Date   CARPAL TUNNEL RELEASE Bilateral    CESAREAN SECTION     KNEE ARTHROSCOPY Right    TOTAL KNEE ARTHROPLASTY Right 05/12/2021   Procedure: TOTAL KNEE ARTHROPLASTY;  Surgeon: Sheral Apley, MD;  Location: [facility];  Service: Orthopedics;  Laterality: Right;   TUBAL LIGATION     UMBILICAL HERNIA REPAIR      History reviewed. No pertinent family history. Social History:  reports that she has been smoking cigarettes. She has a 10.00 pack-year smoking history. She has never used smokeless tobacco. She reports that she does not drink alcohol and does not use drugs.  Allergies:  Allergies  Allergen Reactions   Chlorhexidine Itching    Can tolerate chg soap if rinsed off, does not tolerate wipes.    Medications Prior to Admission  Medication Sig Dispense Refill   acetaminophen (TYLENOL) 325 MG tablet Take 650 mg by mouth every 6 (six) hours as needed for moderate pain or headache.     albuterol (VENTOLIN HFA) 108 (90 Base) MCG/ACT inhaler Inhale 1 puff into the lungs every 6 (six)  hours as needed for shortness of breath or wheezing.     amLODipine (NORVASC) 10 MG tablet Take 10 mg by mouth daily.     amoxicillin (AMOXIL) 500 MG capsule Take 2,000 mg by mouth See admin instructions. Take 2000 mg by mouth prior to dental procedures     buPROPion (WELLBUTRIN SR) 150 MG 12 hr tablet Take 150 mg by mouth 2 (two) times daily.     Cholecalciferol (VITAMIN D3 PO) Take 1 tablet by mouth daily.     cyclobenzaprine (FLEXERIL) 10 MG tablet Take 10 mg by mouth in the morning and at bedtime.     hydrOXYzine (ATARAX) 25 MG tablet Take 25-50 mg by mouth See admin instructions. Take 25 mg by mouth in the morning and 50 mg at bedtime     meloxicam (MOBIC) 15 MG tablet Take 15 mg by mouth daily as needed for pain.     methocarbamol (ROBAXIN) 500 MG tablet Take 500 mg by mouth 2 (two) times daily as needed for muscle spasms.     mometasone-formoterol (DULERA) 200-5 MCG/ACT AERO Inhale 2 puffs into the lungs 2 (two) times daily.     Multiple Vitamins-Minerals (WOMENS MULTIVITAMIN) TABS Take 1 tablet by mouth daily.     NON FORMULARY Pt uses a cpap nightly     omeprazole (PRILOSEC) 40 MG capsule Take 40 mg by mouth 2 (two) times daily.     phentermine (  ADIPEX-P) 37.5 MG tablet Take 37.5 mg by mouth daily before breakfast.     primidone (MYSOLINE) 50 MG tablet Take 100 mg by mouth at bedtime.     SUPER B COMPLEX/C PO Take 1 tablet by mouth daily.     traZODone (DESYREL) 150 MG tablet Take 300 mg by mouth at bedtime.     triamcinolone cream (KENALOG) 0.1 % Apply 1 application  topically daily as needed (itching).     aspirin 81 MG EC tablet Take 81 mg by mouth daily. (Patient not taking: Reported on 12/29/2021)     atorvastatin (LIPITOR) 20 MG tablet Take 20 mg by mouth at bedtime.     Omega-3 1000 MG CAPS Take 1,000 mg by mouth daily. (Patient not taking: Reported on 12/29/2021)     oxyCODONE (ROXICODONE) 5 MG immediate release tablet Take 1 tablet (5 mg total) by mouth every 4 (four) hours as  needed for severe pain. (Patient not taking: Reported on 12/29/2021) 28 tablet 0    Results for orders placed or performed during the hospital encounter of 01/04/22 (from the past 48 hour(s))  ABO/Rh     Status: None   Collection Time: 01/04/22  7:45 AM  Result Value Ref Range   ABO/RH(D)      A POS Performed at [facility], [address], [place], [place] [address]    No results found.  Pertinent items noted in HPI and remainder of comprehensive ROS otherwise negative.  Blood pressure 127/65, pulse 79, temperature 98.6 F (37 C), temperature source Oral, resp. rate 18, height 5' 6.5" (1.689 m), weight 122.5 kg, SpO2 97 %.  Patient is awake and alert.  She is oriented and appropriate.  Speech is fluent.  Judgment insight are intact.  Cranial nerve function normal bilateral.  Motor examination reveals some mild dorsiflexion weakness on the left side otherwise motor strength intact.  Sensory examination with decrease sensation pinprick and light touch in her left L5 and S1 dermatomes.  Deep Temrex is normal active except her Achilles reflexes are absent bilaterally.  Gait is antalgic.  Posture is reasonably normal.  Examination head ears eyes nose and throat is unremarkable her chest and abdomen are benign aside from being obese.  Extremities are free from injury or deformity. Assessment/Plan L4-5 degenerative spondylolisthesis with stenosis, L5-S1 degenerative disc disease with severe foraminal stenosis.  Plan bilateral L4-5 and L5-S1 decompressive laminotomies and foraminotomies followed by posterior lumbar to body fusion utilizing interbody cages, local harvested autograft, and augmented with posterior arthrodesis utilizing segmental pedicle screw fixation and local autografting.  Risks and benefits of been explained.  Patient wishes to proceed.  Sherilyn Cooter A Khallid Pasillas 01/04/2022, 8:58 AM

## 2022-01-04 NOTE — Anesthesia Postprocedure Evaluation (Signed)
Anesthesia Post Note  Patient: Luiza Carranco  Procedure(s) Performed: Posterior Lumbar Interbody Fusion - Lumbar four-Lumbar five - Lumbar five-Sacral one (Back)     Patient location during evaluation: PACU Anesthesia Type: General Level of consciousness: awake Pain management: pain level controlled Vital Signs Assessment: post-procedure vital signs reviewed and stable Respiratory status: spontaneous breathing, nonlabored ventilation, respiratory function stable and patient connected to nasal cannula oxygen Cardiovascular status: blood pressure returned to baseline and stable Postop Assessment: no apparent nausea or vomiting Anesthetic complications: no   No notable events documented.  Last Vitals:  Vitals:   01/04/22 1421 01/04/22 1641  BP: (!) 140/84 122/83  Pulse: 81 83  Resp: 16 16  Temp: 37.1 C 36.6 C  SpO2: 98% 98%    Last Pain:  Vitals:   01/04/22 1421  TempSrc: Oral  PainSc:                  Renella Steig P Penny Frisbie

## 2022-01-04 NOTE — Transfer of Care (Signed)
Immediate Anesthesia Transfer of Care Note  Patient: Brittney Hunt  Procedure(s) Performed: Posterior Lumbar Interbody Fusion - Lumbar four-Lumbar five - Lumbar five-Sacral one (Back)  Patient Location: PACU  Anesthesia Type:General  Level of Consciousness: drowsy, patient cooperative and responds to stimulation  Airway & Oxygen Therapy: Patient Spontanous Breathing  Post-op Assessment: Report given to RN  Post vital signs: Reviewed and stable  Last Vitals:  Vitals Value Taken Time  BP    Temp    Pulse    Resp    SpO2      Last Pain:  Vitals:   01/04/22 0756  TempSrc:   PainSc: 6          Complications: No notable events documented.

## 2022-01-04 NOTE — Progress Notes (Signed)
Orthopedic Tech Progress Note Patient Details:  [name] 1960/10/30 [id number]  Patient has BACK BRACE from Hunt   Patient ID: Brittney Hunt, female   DOB: 1960/12/22, 62 y.o.   MRN: [id number]  [name] 01/04/2022, 1:43 PM

## 2022-01-04 NOTE — Brief Op Note (Signed)
01/04/2022  12:46 PM  PATIENT:  Brittney Hunt  61 y.o. female  PRE-OPERATIVE DIAGNOSIS:  Spondylolisthesis  POST-OPERATIVE DIAGNOSIS:  Spondylolisthesis  PROCEDURE:  Procedure(s): Posterior Lumbar Interbody Fusion - Lumbar four-Lumbar five - Lumbar five-Sacral one (N/A)  SURGEON:  Surgeon(s) and Role:    Julio Sicks, MD - Primary  PHYSICIAN ASSISTANT:   ASSISTANTSMarland Mcalpine   ANESTHESIA:   general  EBL:  400 mL   BLOOD ADMINISTERED:none  DRAINS: none   LOCAL MEDICATIONS USED:  MARCAINE     SPECIMEN:  No Specimen  DISPOSITION OF SPECIMEN:  N/A  COUNTS:  YES  TOURNIQUET:  * No tourniquets in log *  DICTATION: .Dragon Dictation  PLAN OF CARE: Admit for overnight observation  PATIENT DISPOSITION:  PACU - hemodynamically stable.   Delay start of Pharmacological VTE agent (>24hrs) due to surgical blood loss or risk of bleeding: yes

## 2022-01-04 NOTE — Op Note (Signed)
Date of procedure: 01/04/2022  Date of dictation: Same  Service: Neurosurgery  Preoperative diagnosis: L4-5 grade 1 degenerative spondylolisthesis with angulation and stenosis, L5-S1 degenerative disc disease with severe foraminal stenosis and radiculopathy  Postoperative diagnosis: Same  Procedure Name: Bilateral L4-5 and bilateral L5-S1 decompressive laminotomies and foraminotomies, more than would be required for simple interbody fusion alone.  Bilateral L4-5 and bilateral L5-S1 ponte osteotomies to facilitate sagittal plane restoration for treatment of deformity  L4-5 and L5-S1 posterior lumbar interbody fusion utilizing interbody cages, locally harvested autograft, and morselized allograft  L4-5 S1 posterior lateral arthrodesis utilizing nonsegmental pedicle screw fixation and local autografting.  Surgeon:Jessi Pitstick A.Darlene Bartelt, M.D.  Asst. Surgeon: Doran Durand, NP  Anesthesia: General  Indication: 61 year old female with severe back and bilateral lower extremity symptoms consistent with lower lumbar radiculopathy and neurogenic claudication failing conservative management.  Work-up demonstrates evidence of degenerative spondylolisthesis with angulation and sagittal plane abnormality at L4-5.  L5-S1 patient has evidence of marked disc degeneration with severe foraminal stenosis left worse than right.  Remainder of her lumbar spine appears reasonably healthy.  Patient presents now for decompression and fusion in hopes of improving her symptoms.  Operative note: After induction of anesthesia, patient position prone onto Wilson frame and appropriately padded.  Lumbar region prepped and draped sterilely.  Incision made overlying L4-5 and S1.  Dissection performed bilaterally.  Retractor placed.  Fluoroscopy used.  Levels confirmed.  Decompressive laminotomies and facetectomies were then performed bilaterally using Leksell rongeurs, Kerrison rongeurs and high-speed drill to remove the inferior two thirds  of the lamina of L4 and L5 bilaterally the entire inferior facet of L4 bilaterally and the majority of the superior facet of L5 bilaterally.  Superior aspect the L5 lamina was also resected.  Ligament flavum elevated and resected.  Wide decompressive foraminotomies were performed on the course exiting L4-L5 and S1 nerve roots bilaterally.  At this point further facet was resected at L4-5 and L5-S1 to complete ponte osteotomies to allow for mobilization and reduction of the patient's sagittal plane deformity.  Bilateral discectomies then performed at L4-5 and L5-S1.  The spaces then prepared for interbody fusion.  With distractors placed patient's right side to space was further cleaned of soft tissue.  On the left side at L4-5 and 11 mm Med[employer]onic expandable cage was then impacted into place and then expanded.  Distractor removed patient's right side.  Disc base prepared on the right side.  Morselized autograft packed in the interspace.  A second cage was then impacted into place and expanded.  Procedures then repeated at L5-S1 using 9 mm expanded cages and autograft again.  Pedicles of L4-L5 and S1 were identified using surface landmarks and intraoperative fluoroscopy and superficial bone overlying the pedicle was then removed using high-speed drill.  Pedicle was then probed using pedicle all each pedicle all track was then probed and found to be solidly within the bone.  Each pedicle all track was then tapped with a screw tap.  Screw temple was probed and found to be solidly within the bone.  5.75 mm radius brand screws from [employer] placed bilaterally at L4-L5 and S1.  Final images reveal good position of the cages and the hardware at the proper upper level with normal alignment of the spine.  Short segment titanium rod placed over the screw heads at L4-5 and S1.  Locking caps placed over the screw heads.  Locking caps then engaged with a construct under mild compression.  Transverse processes and sacral  ala  were decorticated.  Morselized autograft was packed posterior laterally.  The cages were further packed with autograft.  Gelfoam was placed over the laminotomy defects.  Vancomycin powder was placed in the deep wound space.  Wounds then closed in layers with Vicryl sutures.  Steri-Strips and sterile dressing were applied.  No apparent complications.  Patient tolerated the procedure well and she returns recovery and postop.

## 2022-01-04 NOTE — Anesthesia Preprocedure Evaluation (Addendum)
Anesthesia Evaluation  Patient identified by MRN, date of birth, ID band Patient awake    Reviewed: Allergy & Precautions, NPO status , Patient's Chart, lab work & pertinent test results  Airway Mallampati: I  TM Distance: >3 FB Neck ROM: Full    Dental  (+) Poor Dentition, Chipped, Missing   Pulmonary sleep apnea and Continuous Positive Airway Pressure Ventilation , COPD,  COPD inhaler, Current Smoker and Patient abstained from smoking.,    Pulmonary exam normal        Cardiovascular hypertension, Pt. on medications Normal cardiovascular exam  ECG: NSR, rate 87   Neuro/Psych PSYCHIATRIC DISORDERS Anxiety Depression negative neurological ROS     GI/Hepatic Neg liver ROS, hiatal hernia, GERD  Medicated and Controlled,  Endo/Other  diabetesMorbid obesity  Renal/GU negative Renal ROS     Musculoskeletal  (+) Arthritis ,   Abdominal (+) + obese,   Peds  Hematology negative hematology ROS (+)   Anesthesia Other Findings Spondylolisthesis  Reproductive/Obstetrics                           Anesthesia Physical Anesthesia Plan  ASA: 3  Anesthesia Plan: General   Post-op Pain Management:    Induction: Intravenous  PONV Risk Score and Plan: 2 and Ondansetron, Dexamethasone, Midazolam and Treatment may vary due to age or medical condition  Airway Management Planned: Oral ETT  Additional Equipment:   Intra-op Plan:   Post-operative Plan: Extubation in OR  Informed Consent: I have reviewed the patients History and Physical, chart, labs and discussed the procedure including the risks, benefits and alternatives for the proposed anesthesia with the patient or authorized representative who has indicated his/her understanding and acceptance.     Dental advisory given  Plan Discussed with: CRNA  Anesthesia Plan Comments:        Anesthesia Quick Evaluation

## 2022-01-04 NOTE — Anesthesia Procedure Notes (Signed)
Procedure Name: Intubation Date/Time: 01/04/2022 9:20 AM  Performed by: Janace Litten, CRNAPre-anesthesia Checklist: Patient identified, Emergency Drugs available, Suction available and Patient being monitored Patient Re-evaluated:Patient Re-evaluated prior to induction Oxygen Delivery Method: Circle System Utilized Preoxygenation: Pre-oxygenation with 100% oxygen Induction Type: IV induction Ventilation: Mask ventilation without difficulty Laryngoscope Size: Mac and 3 Grade View: Grade I Tube type: Oral Tube size: 7.0 mm Number of attempts: 1 Airway Equipment and Method: Stylet Placement Confirmation: ETT inserted through vocal cords under direct vision, positive ETCO2 and breath sounds checked- equal and bilateral Secured at: 19 cm Tube secured with: Tape Dental Injury: Teeth and Oropharynx as per pre-operative assessment

## 2022-01-05 DIAGNOSIS — M4317 Spondylolisthesis, lumbosacral region: Secondary | ICD-10-CM | POA: Diagnosis not present

## 2022-01-05 MED ORDER — CYCLOBENZAPRINE HCL 10 MG PO TABS: 10.0000 mg | ORAL_TABLET | Freq: Two times a day (BID) | ORAL | 0 refills | Status: DC

## 2022-01-05 MED ORDER — OXYCODONE HCL 10 MG PO TABS: 10.0000 mg | ORAL_TABLET | ORAL | 0 refills | Status: DC | PRN

## 2022-01-05 NOTE — Evaluation (Addendum)
Physical Therapy Evaluation and Discharge Patient Details Name: Scott Fix MRN: [id number] DOB: 1961/01/07 Today's Date: 01/05/2022  History of Present Illness  61 yo s/p 01/04/22 PLIF L4-5 L5-s1 PMH anxiety, arthritis depression DM fatty liver, HTn sleep apnea tremor R TKA  Clinical Impression  Patient evaluated by Physical Therapy with no further acute PT needs identified. All education has been completed and the patient has no further questions. Patient able to demonstrate ambulation without device and up/down 4 steps with rails. PT is signing off. Thank you for this referral.        Recommendations for follow up therapy are one component of a multi-disciplinary discharge planning process, led by the attending physician.  Recommendations may be updated based on patient status, additional functional criteria and insurance authorization.  Follow Up Recommendations No PT follow up (MD may recommend OPPT when cleared for exercise)      Assistance Recommended at Discharge Intermittent Supervision/Assistance  Patient can return home with the following  A little help with walking and/or transfers;A little help with bathing/dressing/bathroom;Assistance with cooking/housework;Help with stairs or ramp for entrance    Equipment Recommendations None recommended by PT  Recommendations for Other Services       Functional Status Assessment Patient has had a recent decline in their functional status and demonstrates the ability to make significant improvements in function in a reasonable and predictable amount of time.     Precautions / Restrictions Precautions Precautions: Back Precaution Booklet Issued: Yes (comment) Precaution Comments: pt able to recall 3/3 precautions from earlier OT session Required Braces or Orthoses: Spinal Brace Spinal Brace: Lumbar corset      Mobility  Bed Mobility               General bed mobility comments: up in chair    Transfers Overall transfer  level: Needs assistance Equipment used: None Transfers: Sit to/from Stand Sit to Stand: Min guard           General transfer comment: pt with slow transition to standing due to pain and leg weakness; repeated x 2 without physical assist    Ambulation/Gait Ambulation/Gait assistance: Min guard Gait Distance (Feet): 90 Feet Assistive device: None Gait Pattern/deviations: Step-through pattern, Decreased stride length   Gait velocity interpretation: >2.62 ft/sec, indicative of community ambulatory   General Gait Details: reports has walked the 400 ft down and back hallway this morning without RW; pt not furniture walking during session  Stairs Stairs: Yes Stairs assistance: Min guard Stair Management: Two rails, Forwards Number of Stairs: 4 General stair comments: pt instructed in sequencing with "strong leg" leading up and "weak leg" leading down  Wheelchair Mobility    Modified Rankin (Stroke Patients Only)       Balance                                             Pertinent Vitals/Pain Pain Assessment Pain Assessment: Faces Faces Pain Scale: Hurts even more Pain Location: back Pain Descriptors / Indicators: Operative site guarding, Sharp Pain Intervention(s): Limited activity within patient's tolerance, Monitored during session, Premedicated before session    Home Living Family/patient expects to be discharged to:: Private residence Living Arrangements: Other relatives (2 brothers) Available Help at Discharge: Family;Available 24 hours/day Type of Home: House Home Access: Stairs to enter Entrance Stairs-Rails: Right;Left;Can reach both Entrance Stairs-Number of Steps: 3   Home  Layout: One level Home Equipment: Agricultural consultant (2 wheels);Crutches      Prior Function Prior Level of Function : Independent/Modified Independent                     Hand Dominance        Extremity/Trunk Assessment   Upper Extremity  Assessment Upper Extremity Assessment: Defer to OT evaluation    Lower Extremity Assessment Lower Extremity Assessment: Generalized weakness (slow to come to stand; partly due to pain)    Cervical / Trunk Assessment Cervical / Trunk Assessment: Other exceptions;Back Surgery Cervical / Trunk Exceptions: obese  Communication   Communication: No difficulties  Cognition Arousal/Alertness: Awake/alert Behavior During Therapy: WFL for tasks assessed/performed Overall Cognitive Status: Within Functional Limits for tasks assessed                                          General Comments      Exercises     Assessment/Plan    PT Assessment Patient does not need any further PT services  PT Problem List         PT Treatment Interventions      PT Goals (Current goals can be found in the Care Plan section)  Acute Rehab PT Goals Patient Stated Goal: go home today PT Goal Formulation: All assessment and education complete, DC therapy    Frequency       Co-evaluation               AM-PAC PT "6 Clicks" Mobility  Outcome Measure Help needed turning from your back to your side while in a flat bed without using bedrails?: A Little Help needed moving from lying on your back to sitting on the side of a flat bed without using bedrails?: A Little Help needed moving to and from a bed to a chair (including a wheelchair)?: A Little Help needed standing up from a chair using your arms (e.g., wheelchair or bedside chair)?: A Little Help needed to walk in hospital room?: A Little Help needed climbing 3-5 steps with a railing? : A Little 6 Click Score: 18    End of Session Equipment Utilized During Treatment: Back brace Activity Tolerance: No increased pain;Patient tolerated treatment well Patient left: in chair;with call bell/phone within reach Nurse Communication: Mobility status;Other (comment) (ok for discharge) PT Visit Diagnosis: Difficulty in walking, not  elsewhere classified (R26.2);Muscle weakness (generalized) (M62.81)    Time: 5329-9242 PT Time Calculation (min) (ACUTE ONLY): 13 min   Charges:   PT Evaluation $PT Eval Low Complexity: 1 Low           Jerolyn Center, PT Acute Rehabilitation Services  Office [phone]   Zena Amos 01/05/2022, 9:47 AM

## 2022-01-05 NOTE — Progress Notes (Signed)
Pt set up on CPAP for night rest wearing her home mask.  Pt tolerating well.

## 2022-01-05 NOTE — Plan of Care (Signed)
Pt given D/C instructions with verbal understanding. Rx's were sent to the pharmacy by MD. Pt's incision is clean and dry with no sign of infection. Pt's IV was removed prior to D/C. Pt received 3-n-1 from [facility] per MD order. Pt D/C'd home via wheelchair per MD order. Pt is stable @ D/C and has no other needs at this time. Cydne Grahn, RN  

## 2022-01-05 NOTE — Evaluation (Signed)
Occupational Therapy Evaluation Patient Details Name: Brittney Hunt MRN: [id number] DOB: 10-Apr-1961 Today's Date: 01/05/2022   History of Present Illness 61 yo s/p 01/04/22 PLIF L4-5 L5-s1 PMH anxiety, arthritis depression DM fatty liver, HTn sleep apnea tremor R TKA   Clinical Impression   Patient evaluated by Occupational Therapy with no further acute OT needs identified. All education has been completed and the patient has no further questions. See below for any follow-up Occupational Therapy or equipment needs. OT to sign off. Thank you for referral.        Recommendations for follow up therapy are one component of a multi-disciplinary discharge planning process, led by the attending physician.  Recommendations may be updated based on patient status, additional functional criteria and insurance authorization.   Follow Up Recommendations  No OT follow up    Assistance Recommended at Discharge PRN  Patient can return home with the following Assist for transportation    Functional Status Assessment  Patient has had a recent decline in their functional status and demonstrates the ability to make significant improvements in function in a reasonable and predictable amount of time.  Equipment Recommendations  BSC/3in1 (bariatric due to body habitus and weight)    Recommendations for Other Services       Precautions / Restrictions Precautions Precautions: Back Precaution Booklet Issued: Yes (comment) Precaution Comments: educated on back precautions and given handout for adls Required Braces or Orthoses: Spinal Brace Spinal Brace: Lumbar corset      Mobility Bed Mobility               General bed mobility comments: up at eob on arrival    Transfers Overall transfer level: Needs assistance Equipment used: None Transfers: Sit to/from Stand Sit to Stand: Min guard           General transfer comment: pt with slow transition to standing due to pain and leg weakness;  repeated x 2 without physical assist      Balance Overall balance assessment: Mild deficits observed, not formally tested                                         ADL either performed or assessed with clinical judgement   ADL Overall ADL's : Needs assistance/impaired Eating/Feeding: Independent   Grooming: Independent   Upper Body Bathing: Independent   Lower Body Bathing: Min guard   Upper Body Dressing : Independent Upper Body Dressing Details (indicate cue type and reason): able to don doff brace Lower Body Dressing: Min guard;Sit to/from stand Lower Body Dressing Details (indicate cue type and reason): cues for back precautions. pt able to hip hike on the bed to avoid being and L LE figure 4 cross Toilet Transfer: Hydrographic surveyor Details (indicate cue type and reason): could benefit from bari BSC due to sides rubbing against handles Toileting- Clothing Manipulation and Hygiene: Min guard       Functional mobility during ADLs: Min guard General ADL Comments: pt noted to reach for environmental supports but declined need for RW during session. PT notified of balance concerns. pt with increased time to fully extend back into full upright posture     Vision Baseline Vision/History: 1 Wears glasses Additional Comments: reading glasses onley     Perception     Praxis      Pertinent Vitals/Pain Pain Assessment Pain Assessment: Faces Faces Pain Scale: Hurts  even more Pain Location: back Pain Descriptors / Indicators: Operative site guarding, Sharp Pain Intervention(s): Monitored during session, Limited activity within patient's tolerance, Premedicated before session, Repositioned     Hand Dominance Right   Extremity/Trunk Assessment Upper Extremity Assessment Upper Extremity Assessment: Overall WFL for tasks assessed   Lower Extremity Assessment Lower Extremity Assessment: Defer to PT evaluation   Cervical / Trunk Assessment Cervical /  Trunk Assessment: Other exceptions;Back Surgery Cervical / Trunk Exceptions: body habitus and need for spinal brace   Communication Communication Communication: No difficulties   Cognition Arousal/Alertness: Awake/alert Behavior During Therapy: WFL for tasks assessed/performed Overall Cognitive Status: Within Functional Limits for tasks assessed                                       General Comments  dressing dry and intact during session prior to dressing    Exercises     Shoulder Instructions      Home Living Family/patient expects to be discharged to:: Private residence Living Arrangements: Other relatives (2 brothers) Available Help at Discharge: Family;Available 24 hours/day Type of Home: House Home Access: Stairs to enter [employer] of Steps: 3 Entrance Stairs-Rails: Right;Left;Can reach both Home Layout: One level     Bathroom Shower/Tub: Chief Strategy Officer: Standard     Home Equipment: Agricultural consultant (2 wheels);Crutches;Shower seat;Hand held shower head;Cane - single point;Grab bars - tub/shower   Additional Comments: dog and cat but brothers x2 live in the home . the brothers will care for the dog  Back handout provided and reviewed adls in detail. Pt educated on: clothing between brace, never sleep in brace, set an alarm at night for medication, avoid sitting for long periods of time, correct bed positioning for sleeping, correct sequence for bed mobility, avoiding lifting more than 5 pounds and never wash directly over incision. All education is complete and patient indicates understanding.     Prior Functioning/Environment Prior Level of Function : Independent/Modified Independent                        OT Problem List:        OT Treatment/Interventions:      OT Goals(Current goals can be found in the care plan section) Acute Rehab OT Goals Potential to Achieve Goals: Good  OT Frequency:       Co-evaluation              AM-PAC OT "6 Clicks" Daily Activity     Outcome Measure Help from another person eating meals?: None Help from another person taking care of personal grooming?: None Help from another person toileting, which includes using toliet, bedpan, or urinal?: None Help from another person bathing (including washing, rinsing, drying)?: A Little Help from another person to put on and taking off regular upper body clothing?: None Help from another person to put on and taking off regular lower body clothing?: A Little 6 Click Score: 22   End of Session Equipment Utilized During Treatment: Back brace Nurse Communication: Mobility status;Precautions  Activity Tolerance: Patient tolerated treatment well Patient left: in chair;with call bell/phone within reach  OT Visit Diagnosis: Unsteadiness on feet (R26.81)                Time: 4944-9675 OT Time Calculation (min): 25 min Charges:  OT General Charges $OT Visit: 1 Visit OT Evaluation $OT  Eval Moderate Complexity: 1 Mod   Brynn, OTR/L  Acute Rehabilitation Services Office: [phone] .   Mateo Flow 01/05/2022, 10:11 AM

## 2022-01-05 NOTE — Discharge Instructions (Signed)

## 2022-01-05 NOTE — Discharge Summary (Signed)
Physician Discharge Summary  Patient ID: Brittney Hunt MRN: [id number] DOB/AGE: 1960-08-22 61 y.o.  Admit date: 01/04/2022 Discharge date: 01/05/2022  Admission Diagnoses:  Discharge Diagnoses:  Principal Problem:   Degenerative spondylolisthesis   Discharged Condition: good  Hospital Course: Patient admitted to hospital where she underwent uncomplicated L4-5 and L5-S1 decompression and fusion.  Postoperative doing very well.  Preoperative back and lower extremity pain much improved.  Standing ambulating and voiding without difficulty.  Ready for discharge home.  Consults:   Significant Diagnostic Studies:  Treatments:   Discharge Exam: Blood pressure (!) 101/91, pulse (!) 103, temperature 98.6 F (37 C), temperature source Oral, resp. rate 16, height 5' 6.5" (1.689 m), weight 122.5 kg, SpO2 96 %. Awake and alert.  Oriented and appropriate.  Motor and sensory function intact.  Wound clean and dry.  Chest and abdomen benign.  Disposition: Discharge disposition: 01-Home or Self Care        Allergies as of 01/05/2022       Reactions   Chlorhexidine Itching   Can tolerate chg soap if rinsed off, does not tolerate wipes.        Medication List     TAKE these medications    acetaminophen 325 MG tablet Commonly known as: TYLENOL Take 650 mg by mouth every 6 (six) hours as needed for moderate pain or headache.   albuterol 108 (90 Base) MCG/ACT inhaler Commonly known as: VENTOLIN HFA Inhale 1 puff into the lungs every 6 (six) hours as needed for shortness of breath or wheezing.   amLODipine 10 MG tablet Commonly known as: NORVASC Take 10 mg by mouth daily.   amoxicillin 500 MG capsule Commonly known as: AMOXIL Take 2,000 mg by mouth See admin instructions. Take 2000 mg by mouth prior to dental procedures   aspirin EC 81 MG tablet Take 81 mg by mouth daily.   atorvastatin 20 MG tablet Commonly known as: LIPITOR Take 20 mg by mouth at bedtime.   buPROPion 150  MG 12 hr tablet Commonly known as: WELLBUTRIN SR Take 150 mg by mouth 2 (two) times daily.   cyclobenzaprine 10 MG tablet Commonly known as: FLEXERIL Take 1 tablet (10 mg total) by mouth in the morning and at bedtime.   Dulera 200-5 MCG/ACT Aero Generic drug: mometasone-formoterol Inhale 2 puffs into the lungs 2 (two) times daily.   hydrOXYzine 25 MG tablet Commonly known as: ATARAX Take 25-50 mg by mouth See admin instructions. Take 25 mg by mouth in the morning and 50 mg at bedtime   meloxicam 15 MG tablet Commonly known as: MOBIC Take 15 mg by mouth daily as needed for pain.   methocarbamol 500 MG tablet Commonly known as: ROBAXIN Take 500 mg by mouth 2 (two) times daily as needed for muscle spasms.   NON FORMULARY Pt uses a cpap nightly   Omega-3 1000 MG Caps Take 1,000 mg by mouth daily.   omeprazole 40 MG capsule Commonly known as: PRILOSEC Take 40 mg by mouth 2 (two) times daily.   Oxycodone HCl 10 MG Tabs Take 1 tablet (10 mg total) by mouth every 3 (three) hours as needed for severe pain ((score 7 to 10)). What changed:  medication strength how much to take when to take this reasons to take this   phentermine 37.5 MG tablet Commonly known as: ADIPEX-P Take 37.5 mg by mouth daily before breakfast.   primidone 50 MG tablet Commonly known as: MYSOLINE Take 100 mg by mouth at bedtime.  SUPER B COMPLEX/C PO Take 1 tablet by mouth daily.   traZODone 150 MG tablet Commonly known as: DESYREL Take 300 mg by mouth at bedtime.   triamcinolone cream 0.1 % Commonly known as: KENALOG Apply 1 application  topically daily as needed (itching).   VITAMIN D3 PO Take 1 tablet by mouth daily.   Womens Multivitamin Tabs Take 1 tablet by mouth daily.               Durable Medical Equipment  (From admission, onward)           Start     Ordered   01/04/22 1416  DME Walker rolling  Once       Question:  Patient needs a walker to treat with the  following condition  Answer:  Degenerative spondylolisthesis   01/04/22 1415   01/04/22 1416  DME 3 n 1  Once        01/04/22 1415             Signed: Sherilyn Cooter A Jamier Urbas 01/05/2022, 8:35 AM

## 2022-01-06 MED FILL — Heparin Sodium (Porcine) Inj 1000 Unit/ML: INTRAMUSCULAR | Qty: 30 | Status: AC

## 2022-01-06 MED FILL — Sodium Chloride IV Soln 0.9%: INTRAVENOUS | Qty: 1000 | Status: AC

## 2022-02-08 ENCOUNTER — Other Ambulatory Visit: Payer: Self-pay

## 2022-02-08 ENCOUNTER — Emergency Department (HOSPITAL_COMMUNITY): Payer: Medicare Other

## 2022-02-08 ENCOUNTER — Inpatient Hospital Stay (HOSPITAL_COMMUNITY)
Admission: EM | Admit: 2022-02-08 | Discharge: 2022-02-15 | DRG: 857 | Disposition: A | Discharge status: Home Health | Payer: Medicare Other | Attending: Neurological Surgery | Admitting: Neurological Surgery

## 2022-02-08 ENCOUNTER — Encounter (HOSPITAL_COMMUNITY): Payer: Self-pay | Admitting: Emergency Medicine

## 2022-02-08 DIAGNOSIS — E876 Hypokalemia: Secondary | ICD-10-CM | POA: Diagnosis present

## 2022-02-08 DIAGNOSIS — B9561 Methicillin susceptible Staphylococcus aureus infection as the cause of diseases classified elsewhere: Secondary | ICD-10-CM | POA: Diagnosis not present

## 2022-02-08 DIAGNOSIS — Z6839 Body mass index (BMI) 39.0-39.9, adult: Secondary | ICD-10-CM | POA: Diagnosis not present

## 2022-02-08 DIAGNOSIS — T8130XA Disruption of wound, unspecified, initial encounter: Secondary | ICD-10-CM | POA: Diagnosis present

## 2022-02-08 DIAGNOSIS — T8149XA Infection following a procedure, other surgical site, initial encounter: Principal | ICD-10-CM | POA: Diagnosis present

## 2022-02-08 DIAGNOSIS — K219 Gastro-esophageal reflux disease without esophagitis: Secondary | ICD-10-CM | POA: Diagnosis present

## 2022-02-08 DIAGNOSIS — Z79899 Other long term (current) drug therapy: Secondary | ICD-10-CM | POA: Diagnosis not present

## 2022-02-08 DIAGNOSIS — F17210 Nicotine dependence, cigarettes, uncomplicated: Secondary | ICD-10-CM | POA: Diagnosis not present

## 2022-02-08 DIAGNOSIS — I10 Essential (primary) hypertension: Secondary | ICD-10-CM | POA: Diagnosis present

## 2022-02-08 DIAGNOSIS — Y792 Prosthetic and other implants, materials and accessory orthopedic devices associated with adverse incidents: Secondary | ICD-10-CM | POA: Diagnosis present

## 2022-02-08 DIAGNOSIS — T84226A Displacement of internal fixation device of vertebrae, initial encounter: Secondary | ICD-10-CM | POA: Diagnosis not present

## 2022-02-08 DIAGNOSIS — E871 Hypo-osmolality and hyponatremia: Secondary | ICD-10-CM | POA: Diagnosis present

## 2022-02-08 DIAGNOSIS — Z1152 Encounter for screening for COVID-19: Secondary | ICD-10-CM

## 2022-02-08 DIAGNOSIS — E119 Type 2 diabetes mellitus without complications: Secondary | ICD-10-CM | POA: Diagnosis present

## 2022-02-08 DIAGNOSIS — E861 Hypovolemia: Secondary | ICD-10-CM | POA: Diagnosis not present

## 2022-02-08 DIAGNOSIS — D649 Anemia, unspecified: Secondary | ICD-10-CM | POA: Diagnosis not present

## 2022-02-08 DIAGNOSIS — M4310 Spondylolisthesis, site unspecified: Secondary | ICD-10-CM

## 2022-02-08 DIAGNOSIS — Z96651 Presence of right artificial knee joint: Secondary | ICD-10-CM | POA: Diagnosis present

## 2022-02-08 DIAGNOSIS — E669 Obesity, unspecified: Secondary | ICD-10-CM | POA: Diagnosis present

## 2022-02-08 DIAGNOSIS — G4730 Sleep apnea, unspecified: Secondary | ICD-10-CM | POA: Diagnosis present

## 2022-02-08 DIAGNOSIS — R7881 Bacteremia: Secondary | ICD-10-CM | POA: Diagnosis not present

## 2022-02-08 DIAGNOSIS — T8463XA Infection and inflammatory reaction due to internal fixation device of spine, initial encounter: Secondary | ICD-10-CM | POA: Diagnosis not present

## 2022-02-08 LAB — URINALYSIS, ROUTINE W REFLEX MICROSCOPIC
Bilirubin Urine: NEGATIVE
Glucose, UA: NEGATIVE mg/dL
Hgb urine dipstick: NEGATIVE
Ketones, ur: 20 mg/dL — AB
Nitrite: NEGATIVE
Protein, ur: 30 mg/dL — AB
Specific Gravity, Urine: 1.015 (ref 1.005–1.030)
pH: 6 (ref 5.0–8.0)

## 2022-02-08 LAB — LACTIC ACID, PLASMA: Lactic Acid, Venous: 1.1 mmol/L (ref 0.5–1.9)

## 2022-02-08 LAB — CBC
HCT: 32.7 % — ABNORMAL LOW (ref 36.0–46.0)
Hemoglobin: 10.8 g/dL — ABNORMAL LOW (ref 12.0–15.0)
MCH: 28.1 pg (ref 26.0–34.0)
MCHC: 33 g/dL (ref 30.0–36.0)
MCV: 85.2 fL (ref 80.0–100.0)
Platelets: 235 10*3/uL (ref 150–400)
RBC: 3.84 MIL/uL — ABNORMAL LOW (ref 3.87–5.11)
RDW: 13.9 % (ref 11.5–15.5)
WBC: 12 10*3/uL — ABNORMAL HIGH (ref 4.0–10.5)
nRBC: 0 % (ref 0.0–0.2)

## 2022-02-08 LAB — BASIC METABOLIC PANEL
Anion gap: 12 (ref 5–15)
BUN: 8 mg/dL (ref 8–23)
CO2: 25 mmol/L (ref 22–32)
Calcium: 9.1 mg/dL (ref 8.9–10.3)
Chloride: 94 mmol/L — ABNORMAL LOW (ref 98–111)
Creatinine, Ser: 0.6 mg/dL (ref 0.44–1.00)
GFR, Estimated: >60 mL/min (ref >60)
Glucose, Bld: 131 mg/dL — ABNORMAL HIGH (ref 70–99)
Potassium: 3.1 mmol/L — ABNORMAL LOW (ref 3.5–5.1)
Sodium: 131 mmol/L — ABNORMAL LOW (ref 135–145)

## 2022-02-08 LAB — RESP PANEL BY RT-PCR (FLU A&B, COVID) ARPGX2
Influenza A by PCR: NEGATIVE
Influenza B by PCR: NEGATIVE
SARS Coronavirus 2 by RT PCR: NEGATIVE

## 2022-02-08 MED ORDER — HYDROXYZINE HCL 25 MG PO TABS
50.0000 mg | ORAL_TABLET | Freq: Every evening | ORAL | Status: DC
  Administered 2022-02-08 – 2022-02-14 (×6): 50 mg via ORAL
  Filled 2022-02-08 (×8): qty 2

## 2022-02-08 MED ORDER — SODIUM CHLORIDE 0.9% FLUSH: 3.0000 mL | INTRAVENOUS | Status: DC | PRN

## 2022-02-08 MED ORDER — BUPROPION HCL ER (SR) 150 MG PO TB12
150.0000 mg | ORAL_TABLET | Freq: Every day | ORAL | Status: DC
  Administered 2022-02-08 – 2022-02-15 (×7): 150 mg via ORAL
  Filled 2022-02-08 (×8): qty 1

## 2022-02-08 MED ORDER — ALBUTEROL SULFATE HFA 108 (90 BASE) MCG/ACT IN AERS: 1.0000 | INHALATION_SPRAY | Freq: Four times a day (QID) | RESPIRATORY_TRACT | Status: DC | PRN

## 2022-02-08 MED ORDER — OXYCODONE HCL 5 MG PO TABS: 5.0000 mg | ORAL_TABLET | ORAL | Status: DC | PRN

## 2022-02-08 MED ORDER — CYCLOBENZAPRINE HCL 10 MG PO TABS: 10.0000 mg | ORAL_TABLET | Freq: Three times a day (TID) | ORAL | Status: DC | PRN

## 2022-02-08 MED ORDER — QUETIAPINE FUMARATE 50 MG PO TABS
200.0000 mg | ORAL_TABLET | Freq: Every day | ORAL | Status: DC
  Administered 2022-02-08 – 2022-02-14 (×7): 200 mg via ORAL
  Filled 2022-02-08 (×7): qty 4

## 2022-02-08 MED ORDER — ACETAMINOPHEN 650 MG RE SUPP: 650.0000 mg | RECTAL | Status: DC | PRN

## 2022-02-08 MED ORDER — HYDROMORPHONE HCL 1 MG/ML IJ SOLN
1.0000 mg | INTRAMUSCULAR | Status: DC | PRN
  Administered 2022-02-08 (×2): 1 mg via INTRAVENOUS
  Administered 2022-02-09: 0.5 mg via INTRAVENOUS
  Administered 2022-02-09 (×3): 1 mg via INTRAVENOUS
  Filled 2022-02-08 (×5): qty 1

## 2022-02-08 MED ORDER — MOMETASONE FURO-FORMOTEROL FUM 200-5 MCG/ACT IN AERO
2.0000 | INHALATION_SPRAY | Freq: Two times a day (BID) | RESPIRATORY_TRACT | Status: DC
  Administered 2022-02-10 – 2022-02-15 (×8): 2 via RESPIRATORY_TRACT
  Filled 2022-02-08: qty 8.8

## 2022-02-08 MED ORDER — SODIUM CHLORIDE 0.9 % IV SOLN
250.0000 mL | INTRAVENOUS | Status: DC
  Administered 2022-02-08: 250 mL via INTRAVENOUS

## 2022-02-08 MED ORDER — TRAZODONE HCL 150 MG PO TABS
300.0000 mg | ORAL_TABLET | Freq: Every day | ORAL | Status: DC
  Administered 2022-02-08 – 2022-02-14 (×7): 300 mg via ORAL
  Filled 2022-02-08 (×7): qty 2

## 2022-02-08 MED ORDER — ALBUTEROL SULFATE (2.5 MG/3ML) 0.083% IN NEBU: 2.5000 mg | INHALATION_SOLUTION | Freq: Four times a day (QID) | RESPIRATORY_TRACT | Status: DC | PRN

## 2022-02-08 MED ORDER — PHENOL 1.4 % MT LIQD: 1.0000 | OROMUCOSAL | Status: DC | PRN

## 2022-02-08 MED ORDER — CEFAZOLIN SODIUM-DEXTROSE 2-4 GM/100ML-% IV SOLN: 2.0000 g | Freq: Three times a day (TID) | INTRAVENOUS | Status: DC

## 2022-02-08 MED ORDER — POLYETHYLENE GLYCOL 3350 17 G PO PACK: 17.0000 g | PACK | Freq: Every day | ORAL | Status: DC | PRN

## 2022-02-08 MED ORDER — POTASSIUM CHLORIDE CRYS ER 20 MEQ PO TBCR
20.0000 meq | EXTENDED_RELEASE_TABLET | ORAL | Status: AC
  Administered 2022-02-08 – 2022-02-09 (×3): 20 meq via ORAL
  Filled 2022-02-08 (×2): qty 1

## 2022-02-08 MED ORDER — TRIAMCINOLONE ACETONIDE 0.1 % EX CREA: 1.0000 | TOPICAL_CREAM | Freq: Every day | CUTANEOUS | Status: DC | PRN

## 2022-02-08 MED ORDER — OXYCODONE HCL 5 MG PO TABS: 10.0000 mg | ORAL_TABLET | ORAL | Status: DC | PRN

## 2022-02-08 MED ORDER — DOCUSATE SODIUM 100 MG PO CAPS
100.0000 mg | ORAL_CAPSULE | Freq: Two times a day (BID) | ORAL | Status: DC
  Administered 2022-02-08: 100 mg via ORAL
  Filled 2022-02-08: qty 1

## 2022-02-08 MED ORDER — SODIUM CHLORIDE 0.9% FLUSH
3.0000 mL | Freq: Two times a day (BID) | INTRAVENOUS | Status: DC
  Administered 2022-02-08 – 2022-02-09 (×2): 3 mL via INTRAVENOUS

## 2022-02-08 MED ORDER — HYDROXYZINE HCL 25 MG PO TABS: 25.0000 mg | ORAL_TABLET | ORAL | Status: DC

## 2022-02-08 MED ORDER — PRIMIDONE 50 MG PO TABS
100.0000 mg | ORAL_TABLET | Freq: Every day | ORAL | Status: DC
  Administered 2022-02-09 – 2022-02-14 (×6): 100 mg via ORAL
  Filled 2022-02-08 (×9): qty 2

## 2022-02-08 MED ORDER — METHOCARBAMOL 500 MG PO TABS
500.0000 mg | ORAL_TABLET | Freq: Every day | ORAL | Status: DC
  Administered 2022-02-08 – 2022-02-11 (×3): 500 mg via ORAL
  Filled 2022-02-08 (×3): qty 1

## 2022-02-08 MED ORDER — CEFAZOLIN SODIUM-DEXTROSE 2-4 GM/100ML-% IV SOLN
2.0000 g | Freq: Three times a day (TID) | INTRAVENOUS | Status: AC
  Administered 2022-02-08 – 2022-02-09 (×2): 2 g via INTRAVENOUS
  Filled 2022-02-08 (×2): qty 100

## 2022-02-08 MED ORDER — ACETAMINOPHEN 325 MG PO TABS: 650.0000 mg | ORAL_TABLET | ORAL | Status: DC | PRN

## 2022-02-08 MED ORDER — HYDROXYZINE HCL 25 MG PO TABS
25.0000 mg | ORAL_TABLET | Freq: Every morning | ORAL | Status: DC
  Administered 2022-02-10 – 2022-02-15 (×6): 25 mg via ORAL
  Filled 2022-02-08 (×6): qty 1

## 2022-02-08 MED ORDER — SODIUM CHLORIDE 0.9 % IV SOLN: INTRAVENOUS | Status: DC

## 2022-02-08 MED ORDER — AMLODIPINE BESYLATE 10 MG PO TABS
10.0000 mg | ORAL_TABLET | Freq: Every day | ORAL | Status: DC
  Administered 2022-02-08 – 2022-02-15 (×7): 10 mg via ORAL
  Filled 2022-02-08 (×2): qty 1
  Filled 2022-02-08: qty 2
  Filled 2022-02-08 (×4): qty 1

## 2022-02-08 MED ORDER — ONDANSETRON HCL 4 MG PO TABS: 4.0000 mg | ORAL_TABLET | Freq: Four times a day (QID) | ORAL | Status: DC | PRN

## 2022-02-08 MED ORDER — ATORVASTATIN CALCIUM 10 MG PO TABS
20.0000 mg | ORAL_TABLET | Freq: Every day | ORAL | Status: DC
  Administered 2022-02-08 – 2022-02-14 (×7): 20 mg via ORAL
  Filled 2022-02-08 (×7): qty 2

## 2022-02-08 MED ORDER — ONDANSETRON HCL 4 MG/2ML IJ SOLN
4.0000 mg | Freq: Four times a day (QID) | INTRAMUSCULAR | Status: DC | PRN
  Administered 2022-02-09: 4 mg via INTRAVENOUS

## 2022-02-08 MED ORDER — PANTOPRAZOLE SODIUM 40 MG PO TBEC
40.0000 mg | DELAYED_RELEASE_TABLET | Freq: Every day | ORAL | Status: DC
  Administered 2022-02-08 – 2022-02-15 (×7): 40 mg via ORAL
  Filled 2022-02-08 (×7): qty 1

## 2022-02-08 MED ORDER — MENTHOL 3 MG MT LOZG: 1.0000 | LOZENGE | OROMUCOSAL | Status: DC | PRN

## 2022-02-08 NOTE — ED Triage Notes (Signed)
Pt. Stated, Brittney Hunt got a wound incision and has developed a purulent from a surgery from Sept 11. In a lot of pain.

## 2022-02-08 NOTE — ED Provider Notes (Signed)
[facility] EMERGENCY DEPARTMENT Provider Note   CSN: [id number] Arrival date & time: 02/08/22  0844     History  Chief Complaint  Patient presents with   Wound Check   Shortness of Breath    Brittney Hunt is a 62 y.o. female.  HPI Patient presents several weeks after lumbar spine fusion now with concern for ongoing pain in her lower back, as well as drainage and some soreness in her upper habitus.  Patient saw her neurosurgeon last week, also was started on antibiotics.  In spite of this her symptoms have been persistent with ongoing drainage as well.  In spite of her soreness in her upper habitus, and anorexia with nausea, she has no new weakness in any extremity, no new incontinence.    Home Medications Prior to Admission medications   Medication Sig Start Date End Date Taking? Authorizing Provider  acetaminophen (TYLENOL) 325 MG tablet Take 650 mg by mouth every 6 (six) hours as needed for moderate pain or headache.    [provider]  albuterol (VENTOLIN HFA) 108 (90 Base) MCG/ACT inhaler Inhale 1 puff into the lungs every 6 (six) hours as needed for shortness of breath or wheezing. 02/15/21   [provider]  amLODipine (NORVASC) 10 MG tablet Take 10 mg by mouth daily. 02/09/21   [provider]  amoxicillin (AMOXIL) 500 MG capsule Take 2,000 mg by mouth See admin instructions. Take 2000 mg by mouth prior to dental procedures    [provider]  aspirin 81 MG EC tablet Take 81 mg by mouth daily. Patient not taking: Reported on 12/29/2021    [provider]  atorvastatin (LIPITOR) 20 MG tablet Take 20 mg by mouth at bedtime. 04/23/21   [provider]  buPROPion (WELLBUTRIN SR) 150 MG 12 hr tablet Take 150 mg by mouth 2 (two) times daily. 01/15/21   [provider]  Cholecalciferol (VITAMIN D3 PO) Take 1 tablet by mouth daily.    [provider]  cyclobenzaprine (FLEXERIL) 10 MG tablet Take 1  tablet (10 mg total) by mouth in the morning and at bedtime. 01/05/22   Earnie Larsson, MD  hydrOXYzine (ATARAX) 25 MG tablet Take 25-50 mg by mouth See admin instructions. Take 25 mg by mouth in the morning and 50 mg at bedtime 01/30/21   [provider]  meloxicam (MOBIC) 15 MG tablet Take 15 mg by mouth daily as needed for pain. 01/30/21   [provider]  methocarbamol (ROBAXIN) 500 MG tablet Take 500 mg by mouth 2 (two) times daily as needed for muscle spasms. 12/14/21   [provider]  mometasone-formoterol (DULERA) 200-5 MCG/ACT AERO Inhale 2 puffs into the lungs 2 (two) times daily.    [provider]  Multiple Vitamins-Minerals (WOMENS MULTIVITAMIN) TABS Take 1 tablet by mouth daily.    [provider]  NON FORMULARY Pt uses a cpap nightly    [provider]  Omega-3 1000 MG CAPS Take 1,000 mg by mouth daily. Patient not taking: Reported on 12/29/2021    [provider]  omeprazole (PRILOSEC) 40 MG capsule Take 40 mg by mouth 2 (two) times daily. 02/08/21   [provider]  oxyCODONE 10 MG TABS Take 1 tablet (10 mg total) by mouth every 3 (three) hours as needed for severe pain ((score 7 to 10)). 01/05/22   Earnie Larsson, MD  phentermine (ADIPEX-P) 37.5 MG tablet Take 37.5 mg by mouth daily before breakfast.  [provider]  primidone (MYSOLINE) 50 MG tablet Take 100 mg by mouth at bedtime. 12/21/21   [provider]  SUPER B COMPLEX/C PO Take 1 tablet by mouth daily.    [provider]  traZODone (DESYREL) 150 MG tablet Take 300 mg by mouth at bedtime. 01/18/21   [provider]  triamcinolone cream (KENALOG) 0.1 % Apply 1 application  topically daily as needed (itching).    [provider]      Allergies    Chlorhexidine    Review of Systems   Review of Systems  All other systems reviewed and are negative.   Physical Exam Updated Vital Signs BP 131/84   Pulse 95   Temp  98.2 F (36.8 C) (Oral)   Resp 18   Ht 5' 6.5" (1.689 m)   Wt 126.6 kg   SpO2 97%   BMI 44.36 kg/m  Physical Exam Vitals and nursing note reviewed.  Constitutional:      General: She is not in acute distress.    Appearance: She is obese. She is ill-appearing. She is not diaphoretic.  HENT:     Head: Normocephalic and atraumatic.  Eyes:     Conjunctiva/sclera: Conjunctivae normal.  Cardiovascular:     Rate and Rhythm: Normal rate and regular rhythm.  Pulmonary:     Effort: Pulmonary effort is normal. No respiratory distress.     Breath sounds: Normal breath sounds. No stridor.  Abdominal:     General: There is no distension.  Skin:    General: Skin is warm and dry.       Neurological:     Mental Status: She is alert and oriented to person, place, and time.     Cranial Nerves: No cranial nerve deficit.     Comments: Moves all extremities spontaneously, distal reflexes plantar surfaces both unremarkable, speech is clear, face is symmetric.  Psychiatric:        Mood and Affect: Mood normal.     ED Results / Procedures / Treatments   Labs (all labs ordered are listed, but only abnormal results are displayed) Labs Reviewed  CBC - Abnormal; Notable for the following components:      Result Value   WBC 12.0 (*)    RBC 3.84 (*)    Hemoglobin 10.8 (*)    HCT 32.7 (*)    All other components within normal limits  BASIC METABOLIC PANEL - Abnormal; Notable for the following components:   Sodium 131 (*)    Potassium 3.1 (*)    Chloride 94 (*)    Glucose, Bld 131 (*)    All other components within normal limits  CULTURE, BLOOD (ROUTINE X 2)  CULTURE, BLOOD (ROUTINE X 2)  RESP PANEL BY RT-PCR (FLU A&B, COVID) ARPGX2  LACTIC ACID, PLASMA  URINALYSIS, ROUTINE W REFLEX MICROSCOPIC  LACTIC ACID, PLASMA    EKG None  Radiology CT Lumbar Spine Wo Contrast  Result Date: 02/08/2022 CLINICAL DATA:  Spine surgery/procedure, postop, infection suspected EXAM: CT LUMBAR SPINE  WITHOUT CONTRAST TECHNIQUE: Multidetector CT imaging of the lumbar spine was performed without intravenous contrast administration. Multiplanar CT image reconstructions were also generated. RADIATION DOSE REDUCTION: This exam was performed according to the departmental dose-optimization program which includes automated exposure control, adjustment of the mA and/or kV according to patient size and/or use of iterative reconstruction technique. COMPARISON:  Radiograph 02/04/2022 FINDINGS: Segmentation: Chest pain Alignment: Unchanged mild anterior physiologic wedging of T12. Trace residual anterolisthesis at L4-L5. Vertebrae:  Postsurgical changes of anterior and posterior fusion from L4-S1. There is lucency along the L4 pedicle screws, right greater than left. Hardware appears otherwise intact. No bony fusion at this time. No subsidence. Paraspinal and other soft tissues: Soft tissue thickening with small amount of fluid and gas along the prior incision, most prominent adjacent to the tip of the L3 spinous process, area measuring up to 2.2 x 2.8 cm (series 6, image 70). This may communicate with the skin surface. Disc levels: Unchanged mild disc height loss at L1-L2. No significant stenosis from L1 through L4. Postsurgical changes of anterior posterior fusion from L4-S1 with residual grade 1 anterolisthesis at L4-L5. There may be a small residual disc and bony spurring protruding into the right L4-L5 neural foramen, abutting the exiting nerve root (series 11, image 40). IMPRESSION: Postsurgical changes of anterior posterior fusion from L4-S1. Lucency along the L4 pedicle screws, right greater than left, concerning for loosening. Soft tissue thickening with small amount fluid and gas along the prior incision, most prominent adjacent to the tip of the L3 spinous process, area measuring up to 2.2 x 2.8 cm. This may communicate with the skin surface, correlate with drainage. Suspected small residual disc and bony spurring  protruding into the right L4-L5 neural foramen, abutting the exiting nerve root. Electronically Signed   By: Maurine Simmering M.D.   On: 02/08/2022 12:16   DG Chest 2 View  Result Date: 02/08/2022 CLINICAL DATA:  Evaluate for pneumonia EXAM: CHEST - 2 VIEW COMPARISON:  CT chest done on 09/25/2021 FINDINGS: Cardiac size is within normal limits. There is increase in AP diameter of chest. There are no signs of pulmonary edema or focal pulmonary consolidation. Transverse linear density in right mid lung fields may suggest scarring or subsegmental atelectasis or minimal thickening of minor fissure. There is no pleural effusion or pneumothorax. IMPRESSION: There are no signs of pulmonary edema or focal pulmonary consolidation. Electronically Signed   By: Elmer Picker M.D.   On: 02/08/2022 11:10    Procedures Procedures    Medications Ordered in ED Medications - No data to display  ED Course/ Medical Decision Making/ A&P This patient with a Hx of degenerative spondylolisthesis, hypertension, diabetes presents to the ED for concern of low back pain, drainage as well as anorexia, nausea, soreness, this involves an extensive number of treatment options, and is a complaint that carries with it a high risk of complications and morbidity.    The differential diagnosis includes surgical site infection, bacteremia, sepsis, dehydration, other infection   Social Determinants of Health:  Obesity  Additional history obtained:  Additional history and/or information obtained from chart review, notable for operation notes From last month with innocuous lumbar spine fusion  After the initial evaluation, orders, including: Triage orders including CT labs x-ray were initiated.   Patient placed on Cardiac and Pulse-Oximetry Monitors. The patient was maintained on a cardiac monitor.  The cardiac monitored showed an rhythm of 90 sinus normal The patient was also maintained on pulse oximetry. The readings  were typically 100% room air normal   On repeat evaluation of the patient stayed the same  Lab Tests:  I personally interpreted labs.  The pertinent results include: Leukocytosis notable  Imaging Studies ordered:  I independently visualized and interpreted imaging which showed fluid collection in the posterior lumbar spine region I agree with the radiologist interpretation  Consultations Obtained:Dispostion / Final MDM:  I requested consultation with the nurse for,  and discussed lab and imaging  findings as well as pertinent plan - they recommend: With Dr. Venetia Constable we discussed the patient's CT scan, surgical history, and today's presentation as well as ongoing symptoms in spite of antibiotics.  Recommendation is for admission.  No early evidence of bacteremia, sepsis with no hypotension.  Some reassurance taken for the patient's preserved distal neurovascular status as well.    Final Clinical Impression(s) / ED Diagnoses Final diagnoses:  Wound dehiscence     Carmin Muskrat, MD 02/08/22 1536

## 2022-02-08 NOTE — Progress Notes (Signed)
Pt refusing cpap for the night. ?

## 2022-02-08 NOTE — ED Notes (Addendum)
Surgeon at bedside.  

## 2022-02-08 NOTE — H&P (Signed)
Neurosurgery H&P  CC: Wound drainage, back pain  HPI: This is a 61 y.o. woman that presents a month post-op from lumbar decompression / PSIF with initially improvement in LBP, then worsening over the past week or two with drainage from the wound for a few days. Drainage is purulent, systemically she feels fatigued and like she has a new cough but no other new complaints except back pain, no radicular pain, no new weakness, numbness, or parasthesias, no recent change in bowel or bladder function. No recent use of anti-platelet or anti-coagulant medications except ASA81.   ROS: A 14 point ROS was performed and is negative except as noted in the HPI.   PMHx:  Past Medical History:  Diagnosis Date   Anxiety    Arthritis    Depression    Diabetes mellitus without complication ([place])    pt denies this, no medication, A1C normal   Fatty liver    GERD (gastroesophageal reflux disease)    History of hiatal hernia    Hypertension    Pneumonia    Sleep apnea    Tremor    FamHx: No family history on file. SocHx:  reports that she has been smoking cigarettes. She has a 10.00 pack-year smoking history. She has never used smokeless tobacco. She reports current drug use. Drug: Marijuana. She reports that she does not drink alcohol.  Exam: Vital signs in last 24 hours: Temp:  [98.4 F (36.9 C)-98.6 F (37 C)] 98.5 F (36.9 C) (10/16 1250) Pulse Rate:  [69-90] 89 (10/16 1250) Resp:  [18-20] 18 (10/16 1250) BP: (129-139)/(79-80) 129/79 (10/16 1250) SpO2:  [98 %-100 %] 98 % (10/16 1250) Weight:  [126.6 kg] 126.6 kg (10/16 1039) General: Awake, alert, cooperative, lying in bed in NAD Head: Normocephalic and atruamatic HEENT: Neck supple Pulmonary: breathing room air comfortably, no evidence of increased work of breathing Cardiac: mildly tachycardic, regular Abdomen: S NT ND Extremities: Warm and well perfused x4 Neuro: Strength 5/5 x4, SILTx4 Incision with purulent drainage   Assessment  and Plan: 61 y.o. woman s/p lumbar decompression and PSIF, now with clear signs of clinical wound infection. CT L-spine personally reviewed, which shows fluid collection with possible soft tissue / surgical bed abscess and lucency in L4 pedicle screws.  -discussed w/ pt, OR tomorrow for wound washout and replacement / revision of the loose hardware -NPO p MN -admit to floor bed -start NS @ 100 at midnight -Bcx pending, will hold Abx until OR culture obtained -hypokalemic, will replete the potassium -hyponatremic, likely hypovolemic, NS @ [place], MD 02/08/22 2:30 PM [facility]

## 2022-02-08 NOTE — Progress Notes (Signed)
Patient arrived to [facility] room 12 alert and oriented, pain level 6/10 and dilaudid given right before arriving to unit. Bed in lowest position call light in reach, will  continue to monitor pt

## 2022-02-09 ENCOUNTER — Other Ambulatory Visit: Payer: Self-pay

## 2022-02-09 ENCOUNTER — Inpatient Hospital Stay (HOSPITAL_COMMUNITY): Payer: Medicare Other | Admitting: Anesthesiology

## 2022-02-09 ENCOUNTER — Encounter (HOSPITAL_COMMUNITY)
Admission: EM | Disposition: A | Discharge status: Home Health | Payer: Self-pay | Source: Home / Self Care | Attending: Neurological Surgery

## 2022-02-09 ENCOUNTER — Encounter (HOSPITAL_COMMUNITY): Payer: Self-pay | Admitting: Neurological Surgery

## 2022-02-09 DIAGNOSIS — T8149XA Infection following a procedure, other surgical site, initial encounter: Secondary | ICD-10-CM | POA: Diagnosis not present

## 2022-02-09 DIAGNOSIS — D649 Anemia, unspecified: Secondary | ICD-10-CM

## 2022-02-09 DIAGNOSIS — T8463XA Infection and inflammatory reaction due to internal fixation device of spine, initial encounter: Secondary | ICD-10-CM

## 2022-02-09 DIAGNOSIS — I10 Essential (primary) hypertension: Secondary | ICD-10-CM

## 2022-02-09 DIAGNOSIS — E119 Type 2 diabetes mellitus without complications: Secondary | ICD-10-CM

## 2022-02-09 DIAGNOSIS — B9561 Methicillin susceptible Staphylococcus aureus infection as the cause of diseases classified elsewhere: Secondary | ICD-10-CM

## 2022-02-09 LAB — BLOOD CULTURE ID PANEL (REFLEXED) - BCID2

## 2022-02-09 LAB — GLUCOSE, CAPILLARY: Glucose-Capillary: 109 mg/dL — ABNORMAL HIGH (ref 70–99)

## 2022-02-09 LAB — SURGICAL PCR SCREEN
MRSA, PCR: NEGATIVE
Staphylococcus aureus: POSITIVE — AB

## 2022-02-09 SURGERY — POSTERIOR LUMBAR FUSION 1 WITH HARDWARE REMOVAL
Anesthesia: General | Site: Back

## 2022-02-09 MED ORDER — PROPOFOL 10 MG/ML IV BOLUS
INTRAVENOUS | Status: DC | PRN
  Administered 2022-02-09: 140 mg via INTRAVENOUS

## 2022-02-09 MED ORDER — ROCURONIUM BROMIDE 10 MG/ML (PF) SYRINGE
PREFILLED_SYRINGE | INTRAVENOUS | Status: AC
  Filled 2022-02-09: qty 10

## 2022-02-09 MED ORDER — OXYCODONE HCL 5 MG PO TABS: 5.0000 mg | ORAL_TABLET | Freq: Once | ORAL | Status: DC | PRN

## 2022-02-09 MED ORDER — CEFAZOLIN SODIUM 1 G IJ SOLR
INTRAMUSCULAR | Status: AC
  Filled 2022-02-09: qty 20

## 2022-02-09 MED ORDER — DOCUSATE SODIUM 100 MG PO CAPS
100.0000 mg | ORAL_CAPSULE | Freq: Two times a day (BID) | ORAL | Status: DC
  Administered 2022-02-09 – 2022-02-14 (×8): 100 mg via ORAL
  Filled 2022-02-09 (×12): qty 1

## 2022-02-09 MED ORDER — DEXAMETHASONE SODIUM PHOSPHATE 4 MG/ML IJ SOLN
INTRAMUSCULAR | Status: DC | PRN
  Administered 2022-02-09: 10 mg via INTRAVENOUS

## 2022-02-09 MED ORDER — CHLORHEXIDINE GLUCONATE 0.12 % MT SOLN
OROMUCOSAL | Status: AC
  Filled 2022-02-09: qty 15

## 2022-02-09 MED ORDER — ONDANSETRON HCL 4 MG PO TABS: 4.0000 mg | ORAL_TABLET | Freq: Four times a day (QID) | ORAL | Status: DC | PRN

## 2022-02-09 MED ORDER — OXYCODONE HCL 5 MG PO TABS: 5.0000 mg | ORAL_TABLET | ORAL | Status: DC | PRN

## 2022-02-09 MED ORDER — POLYETHYLENE GLYCOL 3350 17 G PO PACK: 17.0000 g | PACK | Freq: Every day | ORAL | Status: DC | PRN

## 2022-02-09 MED ORDER — ACETAMINOPHEN 500 MG PO TABS: 1000.0000 mg | ORAL_TABLET | Freq: Once | ORAL | Status: DC | PRN

## 2022-02-09 MED ORDER — THROMBIN 5000 UNITS EX SOLR
CUTANEOUS | Status: AC
  Filled 2022-02-09: qty 5000

## 2022-02-09 MED ORDER — FENTANYL CITRATE (PF) 250 MCG/5ML IJ SOLN
INTRAMUSCULAR | Status: AC
  Filled 2022-02-09: qty 5

## 2022-02-09 MED ORDER — ACETAMINOPHEN 650 MG RE SUPP: 650.0000 mg | RECTAL | Status: DC | PRN

## 2022-02-09 MED ORDER — SODIUM CHLORIDE 0.9 % IV SOLN: 250.0000 mL | INTRAVENOUS | Status: DC

## 2022-02-09 MED ORDER — ROCURONIUM BROMIDE 100 MG/10ML IV SOLN
INTRAVENOUS | Status: DC | PRN
  Administered 2022-02-09: 70 mg via INTRAVENOUS
  Administered 2022-02-09: 10 mg via INTRAVENOUS

## 2022-02-09 MED ORDER — ACETAMINOPHEN 160 MG/5ML PO SOLN: 1000.0000 mg | Freq: Once | ORAL | Status: DC | PRN

## 2022-02-09 MED ORDER — LIDOCAINE 2% (20 MG/ML) 5 ML SYRINGE
INTRAMUSCULAR | Status: DC | PRN
  Administered 2022-02-09: 100 mg via INTRAVENOUS

## 2022-02-09 MED ORDER — MIDAZOLAM HCL 5 MG/5ML IJ SOLN
INTRAMUSCULAR | Status: DC | PRN
  Administered 2022-02-09: 2 mg via INTRAVENOUS

## 2022-02-09 MED ORDER — DEXMEDETOMIDINE HCL IN NACL 80 MCG/20ML IV SOLN
INTRAVENOUS | Status: DC | PRN
  Administered 2022-02-09: 20 ug via BUCCAL
  Administered 2022-02-09: 12 ug via BUCCAL

## 2022-02-09 MED ORDER — ORAL CARE MOUTH RINSE
15.0000 mL | Freq: Once | OROMUCOSAL | Status: AC
  Administered 2022-02-09: 15 mL via OROMUCOSAL

## 2022-02-09 MED ORDER — HYDROMORPHONE HCL 1 MG/ML IJ SOLN
INTRAMUSCULAR | Status: AC
  Filled 2022-02-09: qty 0.5

## 2022-02-09 MED ORDER — KETAMINE HCL 10 MG/ML IJ SOLN
INTRAMUSCULAR | Status: DC | PRN
  Administered 2022-02-09: 40 mg via INTRAVENOUS
  Administered 2022-02-09: 10 mg via INTRAVENOUS

## 2022-02-09 MED ORDER — SODIUM CHLORIDE 0.9% FLUSH: 3.0000 mL | INTRAVENOUS | Status: DC | PRN

## 2022-02-09 MED ORDER — LACTATED RINGERS IV SOLN: INTRAVENOUS | Status: DC

## 2022-02-09 MED ORDER — 0.9 % SODIUM CHLORIDE (POUR BTL) OPTIME
TOPICAL | Status: DC | PRN
  Administered 2022-02-09: 1000 mL

## 2022-02-09 MED ORDER — FENTANYL CITRATE (PF) 100 MCG/2ML IJ SOLN
25.0000 ug | INTRAMUSCULAR | Status: DC | PRN
  Administered 2022-02-09 (×3): 50 ug via INTRAVENOUS

## 2022-02-09 MED ORDER — MIDAZOLAM HCL 2 MG/2ML IJ SOLN
INTRAMUSCULAR | Status: AC
  Filled 2022-02-09: qty 2

## 2022-02-09 MED ORDER — HYDROMORPHONE HCL 1 MG/ML IJ SOLN
INTRAMUSCULAR | Status: DC | PRN
  Administered 2022-02-09: 1 mg via INTRAVENOUS

## 2022-02-09 MED ORDER — SODIUM CHLORIDE 0.9% FLUSH
3.0000 mL | Freq: Two times a day (BID) | INTRAVENOUS | Status: DC
  Administered 2022-02-09 – 2022-02-14 (×9): 3 mL via INTRAVENOUS

## 2022-02-09 MED ORDER — PHENYLEPHRINE 80 MCG/ML (10ML) SYRINGE FOR IV PUSH (FOR BLOOD PRESSURE SUPPORT)
PREFILLED_SYRINGE | INTRAVENOUS | Status: DC | PRN
  Administered 2022-02-09 (×3): 80 ug via INTRAVENOUS
  Administered 2022-02-09: 160 ug via INTRAVENOUS

## 2022-02-09 MED ORDER — MENTHOL 3 MG MT LOZG: 1.0000 | LOZENGE | OROMUCOSAL | Status: DC | PRN

## 2022-02-09 MED ORDER — ACETAMINOPHEN 10 MG/ML IV SOLN
1000.0000 mg | Freq: Once | INTRAVENOUS | Status: DC | PRN
  Administered 2022-02-09: 1000 mg via INTRAVENOUS

## 2022-02-09 MED ORDER — ONDANSETRON HCL 4 MG/2ML IJ SOLN: 4.0000 mg | Freq: Four times a day (QID) | INTRAMUSCULAR | Status: DC | PRN

## 2022-02-09 MED ORDER — FENTANYL CITRATE (PF) 100 MCG/2ML IJ SOLN
INTRAMUSCULAR | Status: AC
  Filled 2022-02-09: qty 2

## 2022-02-09 MED ORDER — CEFAZOLIN SODIUM-DEXTROSE 2-4 GM/100ML-% IV SOLN
2.0000 g | Freq: Three times a day (TID) | INTRAVENOUS | Status: DC
  Administered 2022-02-09 – 2022-02-15 (×18): 2 g via INTRAVENOUS
  Filled 2022-02-09 (×19): qty 100

## 2022-02-09 MED ORDER — OXYCODONE HCL 5 MG PO TABS
10.0000 mg | ORAL_TABLET | ORAL | Status: DC | PRN
  Administered 2022-02-09: 10 mg via ORAL
  Filled 2022-02-09 (×2): qty 2

## 2022-02-09 MED ORDER — HYDROMORPHONE HCL 1 MG/ML IJ SOLN
1.0000 mg | INTRAMUSCULAR | Status: DC | PRN
  Administered 2022-02-09 – 2022-02-10 (×3): 1 mg via INTRAVENOUS
  Filled 2022-02-09 (×3): qty 1

## 2022-02-09 MED ORDER — PHENYLEPHRINE 80 MCG/ML (10ML) SYRINGE FOR IV PUSH (FOR BLOOD PRESSURE SUPPORT)
PREFILLED_SYRINGE | INTRAVENOUS | Status: AC
  Filled 2022-02-09: qty 10

## 2022-02-09 MED ORDER — CHLORHEXIDINE GLUCONATE 0.12 % MT SOLN: 15.0000 mL | Freq: Once | OROMUCOSAL | Status: DC

## 2022-02-09 MED ORDER — HYDROMORPHONE HCL 1 MG/ML IJ SOLN: 1.0000 mg | Freq: Once | INTRAMUSCULAR | Status: DC

## 2022-02-09 MED ORDER — KETAMINE HCL 50 MG/5ML IJ SOSY
PREFILLED_SYRINGE | INTRAMUSCULAR | Status: AC
  Filled 2022-02-09: qty 5

## 2022-02-09 MED ORDER — OXYCODONE HCL 5 MG/5ML PO SOLN: 5.0000 mg | Freq: Once | ORAL | Status: DC | PRN

## 2022-02-09 MED ORDER — THROMBIN 5000 UNITS EX SOLR: OROMUCOSAL | Status: DC | PRN

## 2022-02-09 MED ORDER — ONDANSETRON HCL 4 MG/2ML IJ SOLN
INTRAMUSCULAR | Status: AC
  Filled 2022-02-09: qty 4

## 2022-02-09 MED ORDER — DEXMEDETOMIDINE HCL IN NACL 80 MCG/20ML IV SOLN
INTRAVENOUS | Status: AC
  Filled 2022-02-09: qty 20

## 2022-02-09 MED ORDER — CYCLOBENZAPRINE HCL 10 MG PO TABS
10.0000 mg | ORAL_TABLET | Freq: Three times a day (TID) | ORAL | Status: DC | PRN
  Administered 2022-02-10: 10 mg via ORAL
  Filled 2022-02-09: qty 1

## 2022-02-09 MED ORDER — SUGAMMADEX SODIUM 200 MG/2ML IV SOLN
INTRAVENOUS | Status: DC | PRN
  Administered 2022-02-09 (×2): 200 mg via INTRAVENOUS

## 2022-02-09 MED ORDER — DEXAMETHASONE SODIUM PHOSPHATE 10 MG/ML IJ SOLN
INTRAMUSCULAR | Status: AC
  Filled 2022-02-09: qty 2

## 2022-02-09 MED ORDER — PHENOL 1.4 % MT LIQD: 1.0000 | OROMUCOSAL | Status: DC | PRN

## 2022-02-09 MED ORDER — ACETAMINOPHEN 325 MG PO TABS
650.0000 mg | ORAL_TABLET | ORAL | Status: DC | PRN
  Administered 2022-02-12 – 2022-02-13 (×2): 650 mg via ORAL
  Filled 2022-02-09 (×2): qty 2

## 2022-02-09 MED ORDER — HYDROMORPHONE HCL 1 MG/ML IJ SOLN
INTRAMUSCULAR | Status: AC
  Filled 2022-02-09: qty 1

## 2022-02-09 MED ORDER — FENTANYL CITRATE (PF) 100 MCG/2ML IJ SOLN
INTRAMUSCULAR | Status: DC | PRN
  Administered 2022-02-09 (×2): 50 ug via INTRAVENOUS
  Administered 2022-02-09: 150 ug via INTRAVENOUS

## 2022-02-09 SURGICAL SUPPLY — 64 items
ADH SKN CLS APL DERMABOND .7 (GAUZE/BANDAGES/DRESSINGS) ×1
BAG COUNTER SPONGE SURGICOUNT (BAG) ×1 IMPLANT
BASKET BONE COLLECTION (BASKET) ×1 IMPLANT
BENZOIN TINCTURE PRP APPL 2/3 (GAUZE/BANDAGES/DRESSINGS) IMPLANT
BLADE CLIPPER SURG (BLADE) IMPLANT
BLADE SURG 11 STRL SS (BLADE) ×1 IMPLANT
BUR MATCHSTICK NEURO 3.0 LAGG (BURR) ×1 IMPLANT
BUR PRECISION FLUTE 5.0 (BURR) ×1 IMPLANT
CANISTER SUCT 3000ML PPV (MISCELLANEOUS) ×1 IMPLANT
CAP LCK SPNE (Orthopedic Implant) ×6 IMPLANT
CAP LOCK SPINE RADIUS (Orthopedic Implant) IMPLANT
CAP LOCKING (Orthopedic Implant) ×6 IMPLANT
CNTNR URN SCR LID CUP LEK RST (MISCELLANEOUS) ×1 IMPLANT
CONT SPEC 4OZ STRL OR WHT (MISCELLANEOUS) ×1
COVER BACK TABLE 60X90IN (DRAPES) ×1 IMPLANT
DERMABOND ADVANCED .7 DNX12 (GAUZE/BANDAGES/DRESSINGS) ×1 IMPLANT
DRAIN JP 7F FLT 3/4 PRF SI HBL (DRAIN) IMPLANT
DRAPE C-ARM 42X72 X-RAY (DRAPES) IMPLANT
DRAPE C-ARMOR (DRAPES) IMPLANT
DRAPE LAPAROTOMY 100X72X124 (DRAPES) ×1 IMPLANT
DRAPE SURG 17X23 STRL (DRAPES) ×1 IMPLANT
DURAPREP 26ML APPLICATOR (WOUND CARE) ×1 IMPLANT
ELECT REM PT RETURN 9FT ADLT (ELECTROSURGICAL) ×1
ELECTRODE REM PT RTRN 9FT ADLT (ELECTROSURGICAL) ×1 IMPLANT
EVACUATOR SILICONE 100CC (DRAIN) IMPLANT
GAUZE 4X4 16PLY ~~LOC~~+RFID DBL (SPONGE) IMPLANT
GAUZE SPONGE 4X4 12PLY STRL (GAUZE/BANDAGES/DRESSINGS) IMPLANT
GLOVE BIOGEL PI IND STRL 7.5 (GLOVE) ×2 IMPLANT
GLOVE ECLIPSE 7.5 STRL STRAW (GLOVE) ×2 IMPLANT
GLOVE EXAM NITRILE LRG STRL (GLOVE) IMPLANT
GLOVE EXAM NITRILE XL STR (GLOVE) IMPLANT
GLOVE EXAM NITRILE XS STR PU (GLOVE) IMPLANT
GOWN STRL REUS W/ TWL LRG LVL3 (GOWN DISPOSABLE) ×4 IMPLANT
GOWN STRL REUS W/ TWL XL LVL3 (GOWN DISPOSABLE) IMPLANT
GOWN STRL REUS W/TWL 2XL LVL3 (GOWN DISPOSABLE) IMPLANT
GOWN STRL REUS W/TWL LRG LVL3 (GOWN DISPOSABLE) ×4
GOWN STRL REUS W/TWL XL LVL3 (GOWN DISPOSABLE)
HEMOSTAT POWDER KIT SURGIFOAM (HEMOSTASIS) ×1 IMPLANT
KIT BASIN OR (CUSTOM PROCEDURE TRAY) ×1 IMPLANT
KIT POSITION SURG JACKSON T1 (MISCELLANEOUS) ×1 IMPLANT
KIT TURNOVER KIT B (KITS) ×1 IMPLANT
MILL BONE PREP (MISCELLANEOUS) ×1 IMPLANT
NDL HYPO 18GX1.5 BLUNT FILL (NEEDLE) IMPLANT
NDL SPNL 18GX3.5 QUINCKE PK (NEEDLE) IMPLANT
NEEDLE HYPO 18GX1.5 BLUNT FILL (NEEDLE) IMPLANT
NEEDLE HYPO 22GX1.5 SAFETY (NEEDLE) ×1 IMPLANT
NEEDLE SPNL 18GX3.5 QUINCKE PK (NEEDLE) IMPLANT
NS IRRIG 1000ML POUR BTL (IV SOLUTION) ×1 IMPLANT
PACK LAMINECTOMY NEURO (CUSTOM PROCEDURE TRAY) ×1 IMPLANT
PAD ARMBOARD 7.5X6 YLW CONV (MISCELLANEOUS) ×3 IMPLANT
SCREW 6.75X45MM (Screw) IMPLANT
SPIKE FLUID TRANSFER (MISCELLANEOUS) ×1 IMPLANT
SPONGE SURGIFOAM ABS GEL 100 (HEMOSTASIS) IMPLANT
SPONGE T-LAP 4X18 ~~LOC~~+RFID (SPONGE) IMPLANT
STRIP CLOSURE SKIN 1/2X4 (GAUZE/BANDAGES/DRESSINGS) IMPLANT
SUT MNCRL AB 3-0 PS2 18 (SUTURE) ×1 IMPLANT
SUT VIC AB 0 CT1 18XCR BRD8 (SUTURE) ×1 IMPLANT
SUT VIC AB 0 CT1 8-18 (SUTURE) ×2
SUT VIC AB 2-0 CP2 18 (SUTURE) ×1 IMPLANT
SYR 3ML LL SCALE MARK (SYRINGE) IMPLANT
TOWEL GREEN STERILE (TOWEL DISPOSABLE) ×1 IMPLANT
TOWEL GREEN STERILE FF (TOWEL DISPOSABLE) ×1 IMPLANT
TRAY FOLEY MTR SLVR 16FR STAT (SET/KITS/TRAYS/PACK) ×1 IMPLANT
WATER STERILE IRR 1000ML POUR (IV SOLUTION) ×1 IMPLANT

## 2022-02-09 NOTE — Progress Notes (Addendum)
PHARMACY - PHYSICIAN COMMUNICATION CRITICAL VALUE ALERT - BLOOD CULTURE IDENTIFICATION (BCID)  Brittney Hunt is an 61 y.o. female who presented to [facility] on 02/08/2022 with a chief complaint of surgical site infection  Assessment:  61 y/o female presented to the ED with worsening lower back pain and wound drainage s/p lumbar decompression on 9/11. Blood cx came back positive for MSSA in 2 of 4 bottles, one full set.  Name of physician (or Provider) Contacted: Dr. Zada Finders  Current antibiotics:  Cephalexin 10/12 >> 10/15 Cefazolin 10/16 >> 10/17  Changes to prescribed antibiotics recommended:  Patient is on recommended antibiotics though scheduled to end pre-op; recommend resuming cefazolin 2g IV Q8H.  Results for orders placed or performed during the hospital encounter of 02/08/22  Blood Culture ID Panel (Reflexed) (Collected: 02/08/2022 10:50 AM)  Result Value Ref Range   Enterococcus faecalis NOT DETECTED NOT DETECTED   Enterococcus Faecium NOT DETECTED NOT DETECTED   Listeria monocytogenes NOT DETECTED NOT DETECTED   Staphylococcus species DETECTED (A) NOT DETECTED   Staphylococcus aureus (BCID) DETECTED (A) NOT DETECTED   Staphylococcus epidermidis NOT DETECTED NOT DETECTED   Staphylococcus lugdunensis NOT DETECTED NOT DETECTED   Streptococcus species NOT DETECTED NOT DETECTED   Streptococcus agalactiae NOT DETECTED NOT DETECTED   Streptococcus pneumoniae NOT DETECTED NOT DETECTED   Streptococcus pyogenes NOT DETECTED NOT DETECTED   A.calcoaceticus-baumannii NOT DETECTED NOT DETECTED   Bacteroides fragilis NOT DETECTED NOT DETECTED   Enterobacterales NOT DETECTED NOT DETECTED   Enterobacter cloacae complex NOT DETECTED NOT DETECTED   Escherichia coli NOT DETECTED NOT DETECTED   Klebsiella aerogenes NOT DETECTED NOT DETECTED   Klebsiella oxytoca NOT DETECTED NOT DETECTED   Klebsiella pneumoniae NOT DETECTED NOT DETECTED   Proteus species NOT DETECTED NOT DETECTED    Salmonella species NOT DETECTED NOT DETECTED   Serratia marcescens NOT DETECTED NOT DETECTED   Haemophilus influenzae NOT DETECTED NOT DETECTED   Neisseria meningitidis NOT DETECTED NOT DETECTED   Pseudomonas aeruginosa NOT DETECTED NOT DETECTED   Stenotrophomonas maltophilia NOT DETECTED NOT DETECTED   Candida albicans NOT DETECTED NOT DETECTED   Candida auris NOT DETECTED NOT DETECTED   Candida glabrata NOT DETECTED NOT DETECTED   Candida krusei NOT DETECTED NOT DETECTED   Candida parapsilosis NOT DETECTED NOT DETECTED   Candida tropicalis NOT DETECTED NOT DETECTED   Cryptococcus neoformans/gattii NOT DETECTED NOT DETECTED   Meth resistant mecA/C and MREJ NOT DETECTED NOT DETECTED    Lerry Liner, PharmD Candidate class of 2024 02/09/2022  5:32 AM

## 2022-02-09 NOTE — Anesthesia Preprocedure Evaluation (Addendum)
Anesthesia Evaluation  Patient identified by MRN, date of birth, ID band Patient awake    Reviewed: Allergy & Precautions, NPO status , Patient's Chart, lab work & pertinent test results  History of Anesthesia Complications Negative for: history of anesthetic complications  Airway Mallampati: II  TM Distance: >3 FB Neck ROM: Full    Dental  (+) Dental Advisory Given, Poor Dentition, Missing, Chipped,    Pulmonary sleep apnea , neg COPD, neg recent URI, Current Smoker and Patient abstained from smoking.,    breath sounds clear to auscultation       Cardiovascular hypertension,  Rhythm:Regular     Neuro/Psych PSYCHIATRIC DISORDERS Anxiety Depression negative neurological ROS     GI/Hepatic Neg liver ROS, hiatal hernia, GERD  ,  Endo/Other  diabetesLab Results      Component                Value               Date                      HGBA1C                   5.6                 05/12/2021             Renal/GU negative Renal ROSLab Results      Component                Value               Date                      CREATININE               0.60                02/08/2022                Musculoskeletal  (+) Arthritis ,   Abdominal   Peds  Hematology  (+) Blood dyscrasia, anemia , Lab Results      Component                Value               Date                      WBC                      12.0 (H)            02/08/2022                HGB                      10.8 (L)            02/08/2022                HCT                      32.7 (L)            02/08/2022                MCV  85.2                02/08/2022                PLT                      235                 02/08/2022              Anesthesia Other Findings   Reproductive/Obstetrics                            Anesthesia Physical Anesthesia Plan  ASA: 3  Anesthesia Plan: General   Post-op Pain Management:     Induction: Intravenous  PONV Risk Score and Plan: 2 and Ondansetron and Dexamethasone  Airway Management Planned: Oral ETT  Additional Equipment: None  Intra-op Plan:   Post-operative Plan: Extubation in OR  Informed Consent: I have reviewed the patients History and Physical, chart, labs and discussed the procedure including the risks, benefits and alternatives for the proposed anesthesia with the patient or authorized representative who has indicated his/her understanding and acceptance.     Dental advisory given  Plan Discussed with: CRNA  Anesthesia Plan Comments:        Anesthesia Quick Evaluation

## 2022-02-09 NOTE — Consult Note (Cosign Needed)
I have seen and examined the patient. I have personally reviewed the clinical findings, laboratory findings, microbiological data and imaging studies. The assessment and treatment plan was discussed with the Nurse Practitioner Jeanine Luz. I agree with her/his recommendations except following additions/corrections.  High grade MSSA bacteremia 2/2 post operative lumbar wound infection in the setting of recent bilateral L4-5 and bilateral L5-S1 decompressive laminotomies on 01/04/22 S/p Lumbar wound washout, replacement of bilateral L4 pedicle screws, with new caps*6. OR cx sent   Exam - morbidly obese female lying in the bed, pain controlled              HEENT WNL, mucosa moist              Heart and lung sounds WNL              Abdomen soft, non distended and non tender             Extremities - no pedal edema, TKA with no concerns of infection              Back - posterior lumbar wound with wet bandaged that was changed, no fluctuance or crepitus, mild surrounding erythema and tenderness             Neuro - awake, alert and oriented, able to spontaneously moves all extremities but limited LE movement due to pain              Skin - no rashes   Continue cefazolin  Repeat blood cx 2 sets 10/18 TTE  Monitor CBC and BMP  Fu OR notes and OR cx  Odette Fraction, MD Infectious Disease Physician [facility] [address] [place], [place] [address] Phone: [phone]  Fax: [phone]   [facility]    Date of Admission:  02/08/2022     Total days of antibiotics 1               Reason for Consult: MSSA bacteremia   Referring Provider: [employer] Primary Care Provider: Ellender Hose, NP   ASSESSMENT:  Brittney Hunt is a 60 y/o female s/p lumbar fusion on 9/11 admitted with post-operative wound infection and blood cultures positive for MSSA.  Scheduled for OR today for debridement and hardware  revision. Started on Cefazolin. TTE ordered and will repeat blood cultures. Anticipate prolonged course of antibiotics. Continue current dose of Cefazolin . Remaining medical and supportive care per primary team.   PLAN:  Continue Cefazlin  Surgery today for debridement and hardware revision. Repeat blood cultures TTE ordered. Remaining medical and supportive care per primary team.    Principal Problem:   Wound infection after surgery Active Problems:   MSSA bacteremia    amLODipine  10 mg Oral Daily   atorvastatin  20 mg Oral QHS   buPROPion  150 mg Oral Daily   docusate sodium  100 mg Oral BID   hydrOXYzine  25 mg Oral q AM   And   hydrOXYzine  50 mg Oral QPM   methocarbamol  500 mg Oral Daily   mometasone-formoterol  2 puff Inhalation BID   pantoprazole  40 mg Oral Daily   primidone  100 mg Oral QHS   QUEtiapine  200 mg Oral QHS   sodium chloride flush  3 mL Intravenous Q12H   traZODone  300 mg Oral QHS     HPI: Brittney Hunt is a 61 y.o. female with previous  medical history of hypertension, sleep apnea, fatty liver and recent lumbar fusion on 9/11 presenting with post-operative wound infection.   Brittney Hunt initially underwent lumbar fusion on 9/11 and was progressing well until about 2 weeks post-operative when she began having increasing pain and drainage from the site that was refractory to outpatient Keflex. CT lumbar spine with post surgical changes and lucency along the L4 pedicle screw concerning for loosening; and soft tissue thickening wit small amount of fluid and gas along prior incision possibly communicating to the surface. Afebrile since admission and blood cultures are now positive for MSSA. Started on Cefazolin and scheduled for the OR today for lumbar wound debridement and hardware revision.    Review of Systems: Review of Systems  Constitutional:  Negative for chills, fever and weight loss.  Respiratory:  Negative for cough, shortness of breath and  wheezing.   Cardiovascular:  Negative for chest pain and leg swelling.  Gastrointestinal:  Negative for abdominal pain, constipation, diarrhea, nausea and vomiting.  Musculoskeletal:  Positive for back pain.  Skin:  Negative for rash.     Past Medical History:  Diagnosis Date   Anxiety    Arthritis    Depression    Diabetes mellitus without complication (HCC)    pt denies this, no medication, A1C normal   Fatty liver    GERD (gastroesophageal reflux disease)    History of hiatal hernia    Hypertension    Pneumonia    Sleep apnea    Tremor    Past Surgical History:  Procedure Laterality Date   BACK SURGERY     CARPAL TUNNEL RELEASE Bilateral    CESAREAN SECTION     KNEE ARTHROSCOPY Right    TOTAL KNEE ARTHROPLASTY Right 05/12/2021   Procedure: TOTAL KNEE ARTHROPLASTY;  Surgeon: Sheral Apley, MD;  Location: [facility];  Service: Orthopedics;  Laterality: Right;   TUBAL LIGATION     UMBILICAL HERNIA REPAIR      Social History   Tobacco Use   Smoking status: Every Day    Packs/day: 0.25    Years: 40.00    Total pack years: 10.00    Types: Cigarettes   Smokeless tobacco: Never  Vaping Use   Vaping Use: Never used  Substance Use Topics   Alcohol use: Never   Drug use: Yes    Types: Marijuana    No family history on file.  Allergies  Allergen Reactions   Chlorhexidine Itching    Can tolerate chg soap if rinsed off, does not tolerate wipes.    OBJECTIVE: Blood pressure 130/76, pulse 81, temperature 98 F (36.7 C), temperature source Oral, resp. rate 18, height 5' 6.5" (1.689 m), weight 112.7 kg, SpO2 95 %.  Physical Exam Constitutional:      General: She is not in acute distress.    Appearance: She is well-developed.  Cardiovascular:     Rate and Rhythm: Normal rate and regular rhythm.     Heart sounds: Normal heart sounds.  Pulmonary:     Effort: Pulmonary effort is normal.     Breath sounds: Normal breath sounds.  Musculoskeletal:     Comments:  Surgical incision with soaked dressing and sheets with purulent drainage.   Skin:    General: Skin is warm and dry.  Neurological:     Mental Status: She is alert and oriented to person, place, and time.  Psychiatric:        Behavior: Behavior normal.  Thought Content: Thought content normal.        Judgment: Judgment normal.     Lab Results Lab Results  Component Value Date   WBC 12.0 (H) 02/08/2022   HGB 10.8 (L) 02/08/2022   HCT 32.7 (L) 02/08/2022   MCV 85.2 02/08/2022   PLT 235 02/08/2022    Lab Results  Component Value Date   CREATININE 0.60 02/08/2022   BUN 8 02/08/2022   NA 131 (L) 02/08/2022   K 3.1 (L) 02/08/2022   CL 94 (L) 02/08/2022   CO2 25 02/08/2022    Lab Results  Component Value Date   ALT 17 01/01/2022   AST 17 01/01/2022   ALKPHOS 85 01/01/2022   BILITOT 0.5 01/01/2022     Microbiology: Recent Results (from the past 240 hour(s))  Resp Panel by RT-PCR (Flu A&B, Covid) Anterior Nasal Swab     Status: None   Collection Time: 02/08/22 10:29 AM   Specimen: Anterior Nasal Swab  Result Value Ref Range Status   SARS Coronavirus 2 by RT PCR NEGATIVE NEGATIVE Final    Comment: (NOTE) SARS-CoV-2 target nucleic acids are NOT DETECTED.  The SARS-CoV-2 RNA is generally detectable in upper respiratory specimens during the acute phase of infection. The lowest concentration of SARS-CoV-2 viral copies this assay can detect is 138 copies/mL. A negative result does not preclude SARS-Cov-2 infection and should not be used as the sole basis for treatment or other patient management decisions. A negative result may occur with  improper specimen collection/handling, submission of specimen other than nasopharyngeal swab, presence of viral mutation(s) within the areas targeted by this assay, and inadequate number of viral copies(<138 copies/mL). A negative result must be combined with clinical observations, patient history, and epidemiological information.  The expected result is Negative.  Fact Sheet for Patients:  BloggerCourse.com  Fact Sheet for Healthcare Providers:  SeriousBroker.it  This test is no t yet approved or cleared by the [place] FDA and  has been authorized for detection and/or diagnosis of SARS-CoV-2 by FDA under an Emergency Use Authorization (EUA). This EUA will remain  in effect (meaning this test can be used) for the duration of the COVID-19 declaration under Section 564(b)(1) of the Act, 21 U.S.C.section 360bbb-3(b)(1), unless the authorization is terminated  or revoked sooner.       Influenza A by PCR NEGATIVE NEGATIVE Final   Influenza B by PCR NEGATIVE NEGATIVE Final    Comment: (NOTE) The Xpert Xpress SARS-CoV-2/FLU/RSV plus assay is intended as an aid in the diagnosis of influenza from Nasopharyngeal swab specimens and should not be used as a sole basis for treatment. Nasal washings and aspirates are unacceptable for Xpert Xpress SARS-CoV-2/FLU/RSV testing.  Fact Sheet for Patients: BloggerCourse.com  Fact Sheet for Healthcare Providers: SeriousBroker.it  This test is not yet approved or cleared by the [place] FDA and has been authorized for detection and/or diagnosis of SARS-CoV-2 by FDA under an Emergency Use Authorization (EUA). This EUA will remain in effect (meaning this test can be used) for the duration of the COVID-19 declaration under Section 564(b)(1) of the Act, 21 U.S.C. section 360bbb-3(b)(1), unless the authorization is terminated or revoked.  Performed at [facility], [address] [place], [place] [address]   Blood culture (routine x 2)     Status: None (Preliminary result)   Collection Time: 02/08/22 10:50 AM   Specimen: BLOOD  Result Value Ref Range Status   Specimen Description BLOOD SITE NOT SPECIFIED  Final  Special Requests   Final    BOTTLES DRAWN  AEROBIC AND ANAEROBIC Blood Culture adequate volume   Culture  Setup Time   Final    GRAM POSITIVE COCCI IN CLUSTERS IN BOTH AEROBIC AND ANAEROBIC BOTTLES Organism ID to follow CRITICAL RESULT CALLED TO, READ BACK BY AND VERIFIED WITH: V BRYK,PHARMD@[id number]  02/09/22 MK Performed at [facility], [address] [place], [place] [address]    Culture GRAM POSITIVE COCCI  Final   Report Status PENDING  Incomplete  Blood Culture ID Panel (Reflexed)     Status: Abnormal   Collection Time: 02/08/22 10:50 AM  Result Value Ref Range Status   Enterococcus faecalis NOT DETECTED NOT DETECTED Final   Enterococcus Faecium NOT DETECTED NOT DETECTED Final   Listeria monocytogenes NOT DETECTED NOT DETECTED Final   Staphylococcus species DETECTED (A) NOT DETECTED Final    Comment: CRITICAL RESULT CALLED TO, READ BACK BY AND VERIFIED WITH: V BRYK,PHARMD@0520  02/09/22 MK    Staphylococcus aureus (BCID) DETECTED (A) NOT DETECTED Final    Comment: CRITICAL RESULT CALLED TO, READ BACK BY AND VERIFIED WITH: V BRYK,PHARMD@0520  02/09/22 MK    Staphylococcus epidermidis NOT DETECTED NOT DETECTED Final   Staphylococcus lugdunensis NOT DETECTED NOT DETECTED Final   Streptococcus species NOT DETECTED NOT DETECTED Final   Streptococcus agalactiae NOT DETECTED NOT DETECTED Final   Streptococcus pneumoniae NOT DETECTED NOT DETECTED Final   Streptococcus pyogenes NOT DETECTED NOT DETECTED Final   A.calcoaceticus-baumannii NOT DETECTED NOT DETECTED Final   Bacteroides fragilis NOT DETECTED NOT DETECTED Final   Enterobacterales NOT DETECTED NOT DETECTED Final   Enterobacter cloacae complex NOT DETECTED NOT DETECTED Final   Escherichia coli NOT DETECTED NOT DETECTED Final   Klebsiella aerogenes NOT DETECTED NOT DETECTED Final   Klebsiella oxytoca NOT DETECTED NOT DETECTED Final   Klebsiella pneumoniae NOT DETECTED NOT DETECTED Final   Proteus species NOT DETECTED NOT DETECTED Final   Salmonella species NOT  DETECTED NOT DETECTED Final   Serratia marcescens NOT DETECTED NOT DETECTED Final   Haemophilus influenzae NOT DETECTED NOT DETECTED Final   Neisseria meningitidis NOT DETECTED NOT DETECTED Final   Pseudomonas aeruginosa NOT DETECTED NOT DETECTED Final   Stenotrophomonas maltophilia NOT DETECTED NOT DETECTED Final   Candida albicans NOT DETECTED NOT DETECTED Final   Candida auris NOT DETECTED NOT DETECTED Final   Candida glabrata NOT DETECTED NOT DETECTED Final   Candida krusei NOT DETECTED NOT DETECTED Final   Candida parapsilosis NOT DETECTED NOT DETECTED Final   Candida tropicalis NOT DETECTED NOT DETECTED Final   Cryptococcus neoformans/gattii NOT DETECTED NOT DETECTED Final   Meth resistant mecA/C and MREJ NOT DETECTED NOT DETECTED Final    Comment: Performed at [facility], [address] [place], [place] [address]  Blood culture (routine x 2)     Status: None (Preliminary result)   Collection Time: 02/08/22  3:55 PM   Specimen: BLOOD RIGHT HAND  Result Value Ref Range Status   Specimen Description BLOOD RIGHT HAND  Final   Special Requests   Final    BOTTLES DRAWN AEROBIC AND ANAEROBIC Blood Culture adequate volume   Culture  Setup Time   Final    GRAM POSITIVE COCCI IN CLUSTERS IN BOTH AEROBIC AND ANAEROBIC BOTTLES CRITICAL RESULT CALLED TO, READ BACK BY AND VERIFIED WITH: PHARMD E SPEINBACK [id number] AT 957 AM BY CM Performed at [facility], [address] [place], [place] [address]    Culture Romie MinusGRAM  POSITIVE COCCI  Final   Report Status PENDING  Incomplete  Surgical pcr screen     Status: Abnormal   Collection Time: 02/09/22  6:07 AM   Specimen: Nasal Mucosa; Nasal Swab  Result Value Ref Range Status   MRSA, PCR NEGATIVE NEGATIVE Final   Staphylococcus aureus POSITIVE (A) NEGATIVE Final    Comment: (NOTE) The Xpert SA Assay (FDA approved for NASAL specimens in patients 69 years of age and older), is one component of a comprehensive surveillance program. It  is not intended to diagnose infection nor to guide or monitor treatment. Performed at [facility], [place] [address], [place], [place] [address]    Imaging  CT Lumbar Spine Wo Contrast  Result Date: 02/08/2022 CLINICAL DATA:  Spine surgery/procedure, postop, infection suspected EXAM: CT LUMBAR SPINE WITHOUT CONTRAST TECHNIQUE: Multidetector CT imaging of the lumbar spine was performed without intravenous contrast administration. Multiplanar CT image reconstructions were also generated. RADIATION DOSE REDUCTION: This exam was performed according to the departmental dose-optimization program which includes automated exposure control, adjustment of the mA and/or kV according to patient size and/or use of iterative reconstruction technique. COMPARISON:  Radiograph 02/04/2022 FINDINGS: Segmentation: Chest pain Alignment: Unchanged mild anterior physiologic wedging of T12. Trace residual anterolisthesis at L4-L5. Vertebrae: Postsurgical changes of anterior and posterior fusion from L4-S1. There is lucency along the L4 pedicle screws, right greater than left. Hardware appears otherwise intact. No bony fusion at this time. No subsidence. Paraspinal and other soft tissues: Soft tissue thickening with small amount of fluid and gas along the prior incision, most prominent adjacent to the tip of the L3 spinous process, area measuring up to 2.2 x 2.8 cm (series 6, image 70). This may communicate with the skin surface. Disc levels: Unchanged mild disc height loss at L1-L2. No significant stenosis from L1 through L4. Postsurgical changes of anterior posterior fusion from L4-S1 with residual grade 1 anterolisthesis at L4-L5. There may be a small residual disc and bony spurring protruding into the right L4-L5 neural foramen, abutting the exiting nerve root (series 11, image 40). IMPRESSION: Postsurgical changes of anterior posterior fusion from L4-S1. Lucency along the L4 pedicle screws, right greater than left,  concerning for loosening. Soft tissue thickening with small amount fluid and gas along the prior incision, most prominent adjacent to the tip of the L3 spinous process, area measuring up to 2.2 x 2.8 cm. This may communicate with the skin surface, correlate with drainage. Suspected small residual disc and bony spurring protruding into the right L4-L5 neural foramen, abutting the exiting nerve root. Electronically Signed   By: Maurine Simmering M.D.   On: 02/08/2022 12:16   DG Chest 2 View  Result Date: 02/08/2022 CLINICAL DATA:  Evaluate for pneumonia EXAM: CHEST - 2 VIEW COMPARISON:  CT chest done on 09/25/2021 FINDINGS: Cardiac size is within normal limits. There is increase in AP diameter of chest. There are no signs of pulmonary edema or focal pulmonary consolidation. Transverse linear density in right mid lung fields may suggest scarring or subsegmental atelectasis or minimal thickening of minor fissure. There is no pleural effusion or pneumothorax. IMPRESSION: There are no signs of pulmonary edema or focal pulmonary consolidation. Electronically Signed   By: Elmer Picker M.D.   On: 02/08/2022 11:10     Terri Piedra, NP [facility]  02/09/2022  12:10 PM

## 2022-02-09 NOTE — Progress Notes (Signed)
Pt allergic to CHG wipes. So patient did not receive wipes prior to procedure today.

## 2022-02-09 NOTE — Progress Notes (Signed)
Pt refusing cpap for the night. ?

## 2022-02-09 NOTE — Progress Notes (Signed)
Pt returned back to [facility] room 12 alert and oriented x4 , pain level 5/10, JP drain to charge position. Bed in lowest position, call light in reach, will continue to monitor pt

## 2022-02-09 NOTE — Anesthesia Procedure Notes (Addendum)
Procedure Name: Intubation Date/Time: 02/09/2022 2:57 PM  Performed by: Janace Litten, CRNAPre-anesthesia Checklist: Patient identified, Emergency Drugs available, Suction available and Patient being monitored Patient Re-evaluated:Patient Re-evaluated prior to induction Oxygen Delivery Method: Circle System Utilized Preoxygenation: Pre-oxygenation with 100% oxygen Induction Type: IV induction Ventilation: Mask ventilation without difficulty Laryngoscope Size: Mac and 3 Grade View: Grade II Tube type: Oral Number of attempts: 1 Airway Equipment and Method: Stylet and Oral airway Placement Confirmation: ETT inserted through vocal cords under direct vision, positive ETCO2 and breath sounds checked- equal and bilateral Tube secured with: Tape Dental Injury: Teeth and Oropharynx as per pre-operative assessment  Comments: Intubated by Janalyn Shy

## 2022-02-09 NOTE — Transfer of Care (Signed)
Immediate Anesthesia Transfer of Care Note  Patient: Brittney Hunt  Procedure(s) Performed: LUMBAR WOUND DEBRIDEMENT HARDWARE REVISON (Back)  Patient Location: PACU  Anesthesia Type:General  Level of Consciousness: awake, alert , and patient cooperative  Airway & Oxygen Therapy: Patient Spontanous Breathing  Post-op Assessment: Report given to RN and Post -op Vital signs reviewed and stable  Post vital signs: Reviewed and stable  Last Vitals:  Vitals Value Taken Time  BP 114/61 02/09/22 1630  Temp    Pulse 88 02/09/22 1631  Resp 19 02/09/22 1631  SpO2 95 % 02/09/22 1631  Vitals shown include unvalidated device data.  Last Pain:  Vitals:   02/09/22 1359  TempSrc:   PainSc: 5          Complications: No notable events documented.

## 2022-02-09 NOTE — Progress Notes (Signed)
   02/09/22 2205  Hygiene  Hygiene Patient refused

## 2022-02-09 NOTE — Progress Notes (Signed)
Neurosurgery Service Progress Note  Subjective: No acute events overnight, continued back pain   Objective: Vitals:   02/09/22 0009 02/09/22 0228 02/09/22 0533 02/09/22 0833  BP: (!) 111/57  110/66 130/76  Pulse: 85  83 81  Resp:    18  Temp: 97.8 F (36.6 C)  98.4 F (36.9 C) 98 F (36.7 C)  TempSrc: Oral  Oral Oral  SpO2: (!) 89%  97% 95%  Weight:  112.7 kg    Height:        Physical Exam: Strength 5/5 x4 and SILTx4, wound with purulent drainage  Assessment & Plan: 61 y.o. woman with post-op lumbar infection.  -OR today for wound revision and replacement of loose hardware -Bcx growing MSSA, ID already consulted, TTE pending -on cefazolin day 2  -SCDs/TEDs, hold SQH until POD2  Judith Part  02/09/22 12:25 PM

## 2022-02-09 NOTE — Op Note (Signed)
PATIENT: Brittney Hunt  DAY OF SURGERY: 02/09/22   PRE-OPERATIVE DIAGNOSIS:  Lumbar post-operative wound infection, lumbar pseudoarthrosis   POST-OPERATIVE DIAGNOSIS:  Same   PROCEDURE:  Lumbar wound washout, replacement of bilateral L4 pedicle screws   SURGEON:  Surgeon(s) and Role:    Judith Part, MD - Primary   ANESTHESIA: ETGA   BRIEF HISTORY: This is a 61 year old woman who previously had an L4-5/5-1 decompression and PSIF who presented with purulent drainage and worsening back pain. CT showed loosening of the L4 screws, I therefore recommended washout and revision. This was discussed with the patient as well as risks, benefits, and alternatives and wished to proceed with surgery.   OPERATIVE DETAIL: The patient was taken to the operating room, anesthesia was induced by the anesthesia team, and the patient was placed on the OR table in the prone position. A formal time out was performed with two patient identifiers and confirmed the operative site. The operative site was marked, hair was clipped with surgical clippers, the area was then prepped and draped in a sterile fashion. The patient's prior incision was opened with copious purulence down to the epidural space. This was cultured, evacuated, and copiously irrigated. Infected material was debrided, sutures were removed, and the wound edges were debrided of necrotic material. The hardware was dissected bilaterally and the L4 screws were grossly loose and removed with fingertips without requiring a driver. These were then replaced with upsized screws bilaterally with improved purchase. The rod was replaced, new caps were placed x6.   The wound was copiously irrigated, all instrument and sponge counts were correct. A drain was inserted, tunneled, and secured. The incision was then closed in layers. The patient was then returned to anesthesia for emergence. No apparent complications at the completion of the procedure.   EBL:  56mL    DRAINS: none   SPECIMENS: Lumbar wound cultures   Judith Part, MD 02/09/22 4:19 PM

## 2022-02-10 ENCOUNTER — Inpatient Hospital Stay (HOSPITAL_COMMUNITY): Payer: Medicare Other

## 2022-02-10 DIAGNOSIS — R7881 Bacteremia: Secondary | ICD-10-CM | POA: Diagnosis not present

## 2022-02-10 LAB — ECHOCARDIOGRAM COMPLETE
AR max vel: 2.22 cm2
AV Area VTI: 2.6 cm2
AV Area mean vel: 2.19 cm2
AV Mean grad: 8 mmHg
AV Peak grad: 16.6 mmHg
Ao pk vel: 2.04 m/s
Area-P 1/2: 3.08 cm2
Height: 66.5 in
S' Lateral: 3.3 cm
Weight: 3975.33 oz

## 2022-02-10 MED ORDER — DIPHENHYDRAMINE HCL 12.5 MG/5ML PO ELIX: 12.5000 mg | ORAL_SOLUTION | Freq: Four times a day (QID) | ORAL | Status: DC | PRN

## 2022-02-10 MED ORDER — SODIUM CHLORIDE 0.9% FLUSH: 9.0000 mL | INTRAVENOUS | Status: DC | PRN

## 2022-02-10 MED ORDER — MUSCLE RUB 10-15 % EX CREA
TOPICAL_CREAM | CUTANEOUS | Status: DC | PRN
  Filled 2022-02-10: qty 85

## 2022-02-10 MED ORDER — NALOXONE HCL 0.4 MG/ML IJ SOLN: 0.4000 mg | INTRAMUSCULAR | Status: DC | PRN

## 2022-02-10 MED ORDER — DIPHENHYDRAMINE HCL 50 MG/ML IJ SOLN: 12.5000 mg | Freq: Four times a day (QID) | INTRAMUSCULAR | Status: DC | PRN

## 2022-02-10 MED ORDER — HYDROMORPHONE 1 MG/ML IV SOLN
INTRAVENOUS | Status: DC
  Administered 2022-02-10: 1.5 mg via INTRAVENOUS
  Administered 2022-02-10: 0.6 mg via INTRAVENOUS
  Administered 2022-02-11: 2.7 mg via INTRAVENOUS
  Filled 2022-02-10: qty 30

## 2022-02-10 MED ORDER — ONDANSETRON HCL 4 MG/2ML IJ SOLN: 4.0000 mg | Freq: Four times a day (QID) | INTRAMUSCULAR | Status: DC | PRN

## 2022-02-10 MED ORDER — MUPIROCIN 2 % EX OINT
TOPICAL_OINTMENT | Freq: Two times a day (BID) | CUTANEOUS | Status: DC
  Filled 2022-02-10 (×5): qty 22

## 2022-02-10 NOTE — Progress Notes (Signed)
Neurosurgery Service Progress Note  Subjective: No acute events overnight. Pretty miserable night, was sitting up at bedside crying when I came in for rounds, all back pain   Objective: Vitals:   02/09/22 1937 02/09/22 2333 02/10/22 0547 02/10/22 0740  BP: 112/61 (!) 103/50 (!) 146/83 (!) 147/102  Pulse: 87 77 77 100  Resp:    16  Temp: 98 F (36.7 C) 98 F (36.7 C) 98 F (36.7 C) 98.5 F (36.9 C)  TempSrc: Oral Oral Oral Oral  SpO2: 92% (!) 88% 98% 100%  Weight:      Height:        Physical Exam: Strength 5/5 x4 and SILTx4   Assessment & Plan: 61 y.o. woman s/p wound washout and revision of loosened hardware.  -will swap over to a PCA today for better pain control -ID recs, TTE pending -cefazolin day 3 -continue drain -Cx pending from OR, Bcx staph -SCDs/TEDs, SQH POD2  Judith Part  02/10/22 8:43 AM

## 2022-02-10 NOTE — Progress Notes (Signed)
*  PRELIMINARY RESULTS* Echocardiogram 2D Echocardiogram has been performed.  Brittney Hunt 02/10/2022, 12:09 PM

## 2022-02-10 NOTE — Evaluation (Signed)
Occupational Therapy Evaluation Patient Details Name: Brittney Hunt MRN: [id number] DOB: [birth year] Today's Date: 02/10/2022   History of Present Illness 61 y/o presented 02/08/22 with worsening pain and drainage from surgical site. Pt is  s/p 9/11 PLIF L4-5 L5-S1, and now 10/17 s/p washout and replacement of screws.  PMH: anxiety, arthritis, depression, DM, fatty liver, HTN, sleep apnea, tremor, R TKA   Clinical Impression   Pt presents to OT following lumbar sx with deficits in activity tolerance, pain management, and dynamic standing balance. She required cueing/instructions to follow spinal precautions during ADLs. She required min guard for transfers without an AD. Pt very emotional and crying throughout session re nursing care and therefore it was difficult to assess cognition. Pt would benefit from continued acute OT services to facilitate safe d/c home and optimize occupational performance. Recommend HHOT.       Recommendations for follow up therapy are one component of a multi-disciplinary discharge planning process, led by the attending physician.  Recommendations may be updated based on patient status, additional functional criteria and insurance authorization.   Follow Up Recommendations  Home health OT    Assistance Recommended at Discharge Intermittent Supervision/Assistance  Patient can return home with the following A little help with walking and/or transfers;A little help with bathing/dressing/bathroom    Functional Status Assessment  Patient has had a recent decline in their functional status and demonstrates the ability to make significant improvements in function in a reasonable and predictable amount of time.  Equipment Recommendations  None recommended by OT    Recommendations for Other Services       Precautions / Restrictions Precautions Precautions: Fall;Back Precaution Booklet Issued: No Precaution Comments: verbally reviewed back precautions with  patient Restrictions Weight Bearing Restrictions: No      Mobility Bed Mobility Overal bed mobility: Needs Assistance Bed Mobility: Sit to Supine       Sit to supine: Min assist, HOB elevated   General bed mobility comments: sitting on EOB on arrival. Cues for log roll technique. Assist to bring R LE into bed    Transfers Overall transfer level: Needs assistance Equipment used: None Transfers: Sit to/from Stand, Bed to chair/wheelchair/BSC Sit to Stand: Min guard Stand pivot transfers: Min guard         General transfer comment: min guard for safety      Balance Overall balance assessment: Needs assistance Sitting-balance support: No upper extremity supported, Feet supported Sitting balance-Leahy Scale: Good     Standing balance support: During functional activity, No upper extremity supported                               ADL either performed or assessed with clinical judgement   ADL Overall ADL's : Needs assistance/impaired Eating/Feeding: Independent   Grooming: Wash/dry hands;Oral care;Wash/dry face;Set up;Sitting   Upper Body Bathing: Supervision/ safety;Sitting   Lower Body Bathing: Min guard;Sit to/from stand   Upper Body Dressing : Supervision/safety;Sitting   Lower Body Dressing: Min guard;Sit to/from stand Lower Body Dressing Details (indicate cue type and reason): Cueing for spinal precaution technique Toilet Transfer: Min guard;Ambulation   Toileting- Clothing Manipulation and Hygiene: Min guard       Functional mobility during ADLs: Min guard       Vision   Vision Assessment?: No apparent visual deficits     Perception     Praxis      Pertinent Vitals/Pain Pain Assessment Pain Assessment: 0-10 Pain  Score: 8  Pain Location: low back Pain Descriptors / Indicators: Burning, Sharp Pain Intervention(s): RN gave pain meds during session     Hand Dominance Right   Extremity/Trunk Assessment Upper Extremity  Assessment Upper Extremity Assessment: Overall WFL for tasks assessed   Lower Extremity Assessment Lower Extremity Assessment: Generalized weakness   Cervical / Trunk Assessment Cervical / Trunk Assessment: Back Surgery   Communication Communication Communication: No difficulties   Cognition Arousal/Alertness: Awake/alert Behavior During Therapy: Restless, Anxious Overall Cognitive Status: No family/caregiver present to determine baseline cognitive functioning                                 General Comments: Pt hysterically crying upon entry to room. stating nurses are purposely mistreating her and she has called [facility] for a story about this     General Comments  Emotional support and distraction used to support pt through crying    Exercises     Shoulder Instructions      Home Living Family/patient expects to be discharged to:: Private residence Living Arrangements: Other relatives Available Help at Discharge: Family;Available 24 hours/day Type of Home: House Home Access: Stairs to enter [employer] of Steps: 3 Entrance Stairs-Rails: Right;Left;Can reach both Home Layout: One level     Bathroom Shower/Tub: Chief Strategy Officer: Standard     Home Equipment: Agricultural consultant (2 wheels);Crutches;Shower seat;Hand held shower head;Cane - single point;Grab bars - tub/shower   Additional Comments: dog and cat but brothers x2 live in the home . the brothers will care for the dog      Prior Functioning/Environment Prior Level of Function : Independent/Modified Independent                        OT Problem List: Decreased strength;Impaired balance (sitting and/or standing);Decreased safety awareness;Pain;Decreased knowledge of precautions;Decreased knowledge of use of DME or AE;Decreased activity tolerance      OT Treatment/Interventions: Self-care/ADL training;Therapeutic exercise;Balance training;Patient/family  education;Therapeutic activities;DME and/or AE instruction    OT Goals(Current goals can be found in the care plan section) Acute Rehab OT Goals Patient Stated Goal: decrease pain OT Goal Formulation: With patient Time For Goal Achievement: 02/25/22 Potential to Achieve Goals: Good  OT Frequency: Min 3X/week    Co-evaluation              AM-PAC OT "6 Clicks" Daily Activity     Outcome Measure Help from another person eating meals?: None Help from another person taking care of personal grooming?: A Little Help from another person toileting, which includes using toliet, bedpan, or urinal?: A Little Help from another person bathing (including washing, rinsing, drying)?: A Little Help from another person to put on and taking off regular upper body clothing?: A Little Help from another person to put on and taking off regular lower body clothing?: A Little 6 Click Score: 19   End of Session Equipment Utilized During Treatment: Gait belt Nurse Communication: Mobility status;Patient requests pain meds  Activity Tolerance: Patient limited by pain Patient left: in bed;with nursing/sitter in room;with call bell/phone within reach  OT Visit Diagnosis: Unsteadiness on feet (R26.81);Muscle weakness (generalized) (M62.81);Pain Pain - part of body:  (back)                Time: 4098-1191 OT Time Calculation (min): 18 min Charges:  OT General Charges $OT Visit: 1 Visit OT Evaluation $OT Eval  Low Complexity: 1 Low  Jake Shark, OTR/L, CBIS Acute Rehab Office: [phone]   Crissie Reese 02/10/2022, 11:51 AM

## 2022-02-10 NOTE — Progress Notes (Signed)
Patient declined CPAP for the night  

## 2022-02-10 NOTE — Evaluation (Signed)
Physical Therapy Evaluation Patient Details Name: Brittney Hunt MRN: [id number] DOB: 09/18/1960 Today's Date: 02/10/2022  History of Present Illness  61 y/o presented 02/08/22 with worsening pain and drainage from surgical site. Pt is  s/p 9/11 PLIF L4-5 L5-S1, and now 10/17 s/p washout and replacement of screws.  PMH: anxiety, arthritis, depression, DM, fatty liver, HTN, sleep apnea, tremor, R TKA  Clinical Impression  Patient admitted with the above. PTA, patient lives with brothers and reports independence. Patient presents with weakness, impaired balance, decreased activity tolerance, and pain. Reviewed back precautions with patient as patient unable to remember since previous surgery. Patient required minA for bed mobility and min guard for sit to stand transfer and short ambulation distance with IV pole. Patient limited by pain in low back and R LE with mobility. Patient will benefit from skilled PT services during acute stay to address listed deficits. Recommend HHPT at discharge to maximize functional independence and safety.        Recommendations for follow up therapy are one component of a multi-disciplinary discharge planning process, led by the attending physician.  Recommendations may be updated based on patient status, additional functional criteria and insurance authorization.  Follow Up Recommendations Home health PT      Assistance Recommended at Discharge Intermittent Supervision/Assistance  Patient can return home with the following  A little help with walking and/or transfers;A little help with bathing/dressing/bathroom;Assistance with cooking/housework;Assist for transportation;Help with stairs or ramp for entrance    Equipment Recommendations Rollator (4 wheels)  Recommendations for Other Services       Functional Status Assessment Patient has had a recent decline in their functional status and demonstrates the ability to make significant improvements in function in a  reasonable and predictable amount of time.     Precautions / Restrictions Precautions Precautions: Fall;Back Precaution Booklet Issued: No Precaution Comments: verbally reviewed back precautions with patient Restrictions Weight Bearing Restrictions: No      Mobility  Bed Mobility Overal bed mobility: Needs Assistance Bed Mobility: Sit to Supine       Sit to supine: Min assist, HOB elevated   General bed mobility comments: sitting on EOB on arrival. Cues for log roll technique. Assist to bring R LE into bed    Transfers Overall transfer level: Needs assistance Equipment used: None Transfers: Sit to/from Stand Sit to Stand: Min guard           General transfer comment: min guard for safety    Ambulation/Gait Ambulation/Gait assistance: Min guard Gait Distance (Feet): 15 Feet Assistive device: IV Pole Gait Pattern/deviations: Step-to pattern, Decreased stride length, Decreased stance time - right Gait velocity: decreased     General Gait Details: min guard for safety. Use of IV pole for stability. would benefit from use of RW/rollator  Stairs            Wheelchair Mobility    Modified Rankin (Stroke Patients Only)       Balance Overall balance assessment: Needs assistance Sitting-balance support: No upper extremity supported, Feet supported Sitting balance-Leahy Scale: Good     Standing balance support: Single extremity supported, During functional activity Standing balance-Leahy Scale: Fair                               Pertinent Vitals/Pain Pain Assessment Pain Assessment: Faces Faces Pain Scale: Hurts whole lot Pain Location: low back Pain Descriptors / Indicators: Grimacing, Guarding, Shooting Pain Intervention(s): Monitored during session,  PCA encouraged    Home Living Family/patient expects to be discharged to:: Private residence Living Arrangements: Other relatives (2 brothers) Available Help at Discharge:  Family;Available 24 hours/day Type of Home: House Home Access: Stairs to enter Entrance Stairs-Rails: Right;Left;Can reach both Entrance Stairs-Number of Steps: 3   Home Layout: One level Home Equipment: Agricultural consultant (2 wheels);Crutches;Shower seat;Hand held shower head;Cane - single point;Grab bars - tub/shower Additional Comments: dog and cat but brothers x2 live in the home . the brothers will care for the dog    Prior Function Prior Level of Function : Independent/Modified Independent                     Hand Dominance        Extremity/Trunk Assessment   Upper Extremity Assessment Upper Extremity Assessment: Defer to OT evaluation    Lower Extremity Assessment Lower Extremity Assessment: Generalized weakness    Cervical / Trunk Assessment Cervical / Trunk Assessment: Back Surgery  Communication   Communication: No difficulties  Cognition Arousal/Alertness: Awake/alert Behavior During Therapy: WFL for tasks assessed/performed Overall Cognitive Status: Within Functional Limits for tasks assessed                                          General Comments      Exercises     Assessment/Plan    PT Assessment Patient needs continued PT services  PT Problem List Decreased strength;Decreased activity tolerance;Decreased balance;Decreased mobility;Decreased knowledge of precautions;Decreased safety awareness;Decreased knowledge of use of DME       PT Treatment Interventions DME instruction;Gait training;Therapeutic activities;Stair training;Functional mobility training;Therapeutic exercise;Balance training;Patient/family education    PT Goals (Current goals can be found in the Care Plan section)  Acute Rehab PT Goals Patient Stated Goal: to reduce pain PT Goal Formulation: With patient Time For Goal Achievement: 02/24/22 Potential to Achieve Goals: Good    Frequency Min 5X/week     Co-evaluation               AM-PAC PT "6  Clicks" Mobility  Outcome Measure Help needed turning from your back to your side while in a flat bed without using bedrails?: A Little Help needed moving from lying on your back to sitting on the side of a flat bed without using bedrails?: A Little Help needed moving to and from a bed to a chair (including a wheelchair)?: A Little Help needed standing up from a chair using your arms (e.g., wheelchair or bedside chair)?: A Little Help needed to walk in hospital room?: A Little Help needed climbing 3-5 steps with a railing? : A Lot 6 Click Score: 17    End of Session   Activity Tolerance: Patient tolerated treatment well;Patient limited by pain Patient left: in bed;with call bell/phone within reach;with nursing/sitter in room Nurse Communication: Mobility status PT Visit Diagnosis: Unsteadiness on feet (R26.81);Muscle weakness (generalized) (M62.81);Other abnormalities of gait and mobility (R26.89)    Time: 5053-9767 PT Time Calculation (min) (ACUTE ONLY): 18 min   Charges:   PT Evaluation $PT Eval Moderate Complexity: 1 Mod          Bhavik Cabiness A. Dan Humphreys PT, DPT Acute Rehabilitation Services Office [phone]   [name] 02/10/2022, 10:44 AM

## 2022-02-11 ENCOUNTER — Inpatient Hospital Stay (HOSPITAL_COMMUNITY): Payer: Medicare Other

## 2022-02-11 LAB — CULTURE, BLOOD (ROUTINE X 2)
Special Requests: ADEQUATE
Special Requests: ADEQUATE

## 2022-02-11 MED ORDER — LORAZEPAM 2 MG/ML IJ SOLN
0.5000 mg | Freq: Once | INTRAMUSCULAR | Status: AC
  Administered 2022-02-12: 0.5 mg via INTRAVENOUS
  Filled 2022-02-11: qty 1

## 2022-02-11 MED ORDER — HYDROMORPHONE HCL 1 MG/ML IJ SOLN
0.5000 mg | INTRAMUSCULAR | Status: DC | PRN
  Administered 2022-02-13 – 2022-02-15 (×9): 0.5 mg via INTRAVENOUS
  Filled 2022-02-11 (×9): qty 0.5

## 2022-02-11 MED ORDER — OXYCODONE HCL 5 MG PO TABS
5.0000 mg | ORAL_TABLET | ORAL | Status: DC | PRN
  Administered 2022-02-11 – 2022-02-15 (×6): 5 mg via ORAL
  Filled 2022-02-11 (×6): qty 1

## 2022-02-11 MED ORDER — OXYCODONE HCL ER 15 MG PO T12A
15.0000 mg | EXTENDED_RELEASE_TABLET | Freq: Two times a day (BID) | ORAL | Status: DC
  Administered 2022-02-11 – 2022-02-15 (×9): 15 mg via ORAL
  Filled 2022-02-11 (×9): qty 1

## 2022-02-11 MED ORDER — TIZANIDINE HCL 2 MG PO TABS
4.0000 mg | ORAL_TABLET | Freq: Three times a day (TID) | ORAL | Status: DC
  Administered 2022-02-11 – 2022-02-15 (×12): 4 mg via ORAL
  Filled 2022-02-11 (×13): qty 2

## 2022-02-11 NOTE — Progress Notes (Signed)
Mobility Specialist - Progress Note   02/11/22 1235  Mobility  Activity Dangled on edge of bed  Level of Assistance Independent  Assistive Device None  Distance Ambulated (ft) 0 ft  Activity Response Tolerated poorly  $Mobility charge 1 Mobility    Pt received in bed agreeable to mobility. Moved to EOB to prepare for ambulation but declined further mobility d/t c/o pain in back and shoulder blades. RN notified. Left in EOB w/ call bell and all needs met.   Paulla Dolly Mobility Specialist

## 2022-02-11 NOTE — Anesthesia Postprocedure Evaluation (Signed)
Anesthesia Post Note  Patient: Brittney Hunt  Procedure(s) Performed: LUMBAR WOUND DEBRIDEMENT HARDWARE REVISON (Back)     Patient location during evaluation: PACU Anesthesia Type: General Level of consciousness: awake and alert Pain management: pain level controlled Vital Signs Assessment: post-procedure vital signs reviewed and stable Respiratory status: spontaneous breathing, nonlabored ventilation, respiratory function stable and patient connected to nasal cannula oxygen Cardiovascular status: blood pressure returned to baseline and stable Postop Assessment: no apparent nausea or vomiting Anesthetic complications: no   No notable events documented.  Last Vitals:  Vitals:   02/11/22 0806 02/11/22 0814  BP: 100/66   Pulse: 91   Resp: 18 13  Temp: 37.2 C   SpO2: 98% 93%    Last Pain:  Vitals:   02/11/22 0813  TempSrc:   PainSc: 8                  [name]

## 2022-02-11 NOTE — Progress Notes (Signed)
Wasted 19 ml of from hydromorphone pca pump with Tulann LPN.

## 2022-02-11 NOTE — Progress Notes (Addendum)
Providing Compassionate, Quality Care - Together   Subjective: Patient reports pain between her shoulder blades. She does not feel the PCA has been effective and would like to work towards PO pain medication to better prepare for discharge home.  Objective: Vital signs in last 24 hours: Temp:  [97.8 F (36.6 C)-99 F (37.2 C)] 98.5 F (36.9 C) (10/19 1231) Pulse Rate:  [84-92] 87 (10/19 1231) Resp:  [11-19] 18 (10/19 1231) BP: (100-124)/(50-98) 121/87 (10/19 1231) SpO2:  [93 %-100 %] 99 % (10/19 1231) FiO2 (%):  [76 %-78 %] 78 % (10/18 2351)  Intake/Output from previous day: 10/18 0701 - 10/19 0700 In: 200 [IV Piggyback:200] Out: 60 [Drains:60] Intake/Output this shift: No intake/output data recorded.  Alert and oriented x 4 PERRLA CN II-XII grossly intact MAE, Strength and sensation intact Incision is covered with gauze dressing and Dermabond; Dressing is dry and intact, with a small amount of dried sanguinous drainage   Lab Results: No results for input(s): "WBC", "HGB", "HCT", "PLT" in the last 72 hours. BMET No results for input(s): "NA", "K", "CL", "CO2", "GLUCOSE", "BUN", "CREATININE", "CALCIUM" in the last 72 hours.  Studies/Results: ECHOCARDIOGRAM COMPLETE  Result Date: 02/10/2022    ECHOCARDIOGRAM REPORT   Patient Name:   Brittney Hunt Date of Exam: 02/10/2022 Medical Rec #:  [id number]    Height:       66.5 in Accession #:    [id number]   Weight:       248.5 lb Date of Birth:  Feb 07, 1961    BSA:          2.205 m Patient Age:    61 years     BP:           147/102 mmHg Patient Gender: F            HR:           86 bpm. Exam Location:  Inpatient Procedure: 2D Echo, Cardiac Doppler and Color Doppler Indications:    R78.81 Bacteremia  History:        Patient has no prior history of Echocardiogram examinations.                 Risk Factors:Hypertension and Diabetes. Wound infection after                 surgery.  Sonographer:    Alvino Chapel RCS Referring Phys: [id number]  [place]  1. Left ventricular ejection fraction, by estimation, is 60 to 65%. The left ventricle has normal function. The left ventricle has no regional wall motion abnormalities. Left ventricular diastolic parameters were normal.  2. Right ventricular systolic function is normal. The right ventricular size is normal.  3. Left atrial size was mildly dilated.  4. The mitral valve is normal in structure. Trivial mitral valve regurgitation. No evidence of mitral stenosis.  5. The aortic valve is normal in structure. Aortic valve regurgitation is not visualized. Aortic valve sclerosis/calcification is present, without any evidence of aortic stenosis.  6. The inferior vena cava is normal in size with greater than 50% respiratory variability, suggesting right atrial pressure of 3 mmHg. FINDINGS  Left Ventricle: Left ventricular ejection fraction, by estimation, is 60 to 65%. The left ventricle has normal function. The left ventricle has no regional wall motion abnormalities. The left ventricular internal cavity size was normal in size. There is  no left ventricular hypertrophy. Left ventricular diastolic parameters were normal. Right Ventricle: The right ventricular size is normal. No  increase in right ventricular wall thickness. Right ventricular systolic function is normal. Left Atrium: Left atrial size was mildly dilated. Right Atrium: Right atrial size was normal in size. Pericardium: There is no evidence of pericardial effusion. Mitral Valve: The mitral valve is normal in structure. Mild mitral annular calcification. Trivial mitral valve regurgitation. No evidence of mitral valve stenosis. Tricuspid Valve: The tricuspid valve is normal in structure. Tricuspid valve regurgitation is trivial. No evidence of tricuspid stenosis. Aortic Valve: The aortic valve is normal in structure. Aortic valve regurgitation is not visualized. Aortic valve sclerosis/calcification is present, without any evidence of  aortic stenosis. Aortic valve mean gradient measures 8.0 mmHg. Aortic valve peak  gradient measures 16.6 mmHg. Aortic valve area, by VTI measures 2.60 cm. Pulmonic Valve: The pulmonic valve was normal in structure. Pulmonic valve regurgitation is not visualized. No evidence of pulmonic stenosis. Aorta: The aortic root is normal in size and structure. Venous: The inferior vena cava is normal in size with greater than 50% respiratory variability, suggesting right atrial pressure of 3 mmHg. IAS/Shunts: No atrial level shunt detected by color flow Doppler.  LEFT VENTRICLE PLAX 2D LVIDd:         5.20 cm   Diastology LVIDs:         3.30 cm   LV e' medial:    7.72 cm/s LV PW:         1.00 cm   LV E/e' medial:  18.3 LV IVS:        0.80 cm   LV e' lateral:   9.03 cm/s LVOT diam:     2.00 cm   LV E/e' lateral: 15.6 LV SV:         112 LV SV Index:   51 LVOT Area:     3.14 cm  RIGHT VENTRICLE RV S prime:     13.80 cm/s TAPSE (M-mode): 2.4 cm LEFT ATRIUM             Index        RIGHT ATRIUM           Index LA diam:        3.80 cm 1.72 cm/m   RA Area:     20.00 cm LA Vol (A2C):   92.8 ml 42.08 ml/m  RA Volume:   53.60 ml  24.31 ml/m LA Vol (A4C):   58.4 ml 26.48 ml/m LA Biplane Vol: 75.1 ml 34.05 ml/m  AORTIC VALVE AV Area (Vmax):    2.22 cm AV Area (Vmean):   2.19 cm AV Area (VTI):     2.60 cm AV Vmax:           204.00 cm/s AV Vmean:          136.000 cm/s AV VTI:            0.430 m AV Peak Grad:      16.6 mmHg AV Mean Grad:      8.0 mmHg LVOT Vmax:         144.00 cm/s LVOT Vmean:        95.000 cm/s LVOT VTI:          0.356 m LVOT/AV VTI ratio: 0.83  AORTA Ao Root diam: 3.20 cm MITRAL VALVE MV Area (PHT): 3.08 cm     SHUNTS MV Decel Time: 246 msec     Systemic VTI:  0.36 m MV E velocity: 141.00 cm/s  Systemic Diam: 2.00 cm MV A velocity: 125.00 cm/s MV E/A ratio:  1.13  Arvilla Meres MD Electronically signed by Arvilla Meres MD Signature Date/Time: 02/10/2022/4:50:07 PM    Final     Assessment/Plan: Patient  underwent lumbar wound debridement with revision of lumbar hardware on 02/09/2022 by Dr. Maurice Small. Her initial blood cultures from 02/08/2022 and surgical specimen from 02/09/2022 grew Staphylococcus aureus. Blood cultures from 02/10/2022 have no growth to date. Patient is on IV cefazolin.   LOS: 3 days   -Will add a scheduled extended release pain medication along with short-acting for severe pain. IV dilaudid should only be used when PO pain medication is not effective. I have switched the patient to scheduled tizanidine to see if this helps with the infrascapular pain the patient is experiencing. -Once blood cultures are clear, PICC line can be placed. Patient is medically ready to discharge home once PICC line is placed and patient/support person are educated on administering the IV antibiotic. -Continue to encourage mobilization.   Val Eagle, DNP, AGNP-C Nurse Practitioner  [facility] [address], [place], [place] [address] P: [phone]    F: [phone]  02/11/2022, 12:38 PM

## 2022-02-11 NOTE — Progress Notes (Signed)
Mobility Specialist - Progress Note   02/11/22 1615  Mobility  Activity Ambulated with assistance in hallway  Level of Assistance Standby assist, set-up cues, supervision of patient - no hands on  Assistive Device None  Distance Ambulated (ft) 300 ft  Activity Response Tolerated well  $Mobility charge 1 Mobility    Pt received in bed agreeable to mobility. Left in bed w/ call bell and all needs met.   Paulla Dolly Mobility Specialist

## 2022-02-11 NOTE — Progress Notes (Signed)
Physical Therapy Treatment Patient Details Name: Brittney Hunt MRN: [id number] DOB: [birth year] Today's Date: 02/11/2022   History of Present Illness 61 y/o presented 02/08/22 with worsening pain and drainage from surgical site. Pt is  s/p 9/11 PLIF L4-5 L5-S1, and now 10/17 s/p washout and replacement of screws.  PMH: anxiety, arthritis, depression, DM, fatty liver, HTN, sleep apnea, tremor, R TKA    PT Comments    Patient declined ambulation due to recently returning from mobility with mobility specialists. Agreeable to exercises. Able to complete seated and standing exercises. Completed sit to stand x 5 with no UE support. Complaining of back pain. Reinforced back precautions with patient. D/c plan remains appropriate.     Recommendations for follow up therapy are one component of a multi-disciplinary discharge planning process, led by the attending physician.  Recommendations may be updated based on patient status, additional functional criteria and insurance authorization.  Follow Up Recommendations  Home health PT     Assistance Recommended at Discharge Intermittent Supervision/Assistance  Patient can return home with the following A little help with walking and/or transfers;A little help with bathing/dressing/bathroom;Assistance with cooking/housework;Assist for transportation;Help with stairs or ramp for entrance   Equipment Recommendations  Rollator (4 wheels)    Recommendations for Other Services       Precautions / Restrictions Precautions Precautions: Fall;Back Precaution Booklet Issued: No Precaution Comments: verbally reviewed back precautions with patient Restrictions Weight Bearing Restrictions: No     Mobility  Bed Mobility Overal bed mobility: Needs Assistance Bed Mobility: Rolling, Sidelying to Sit, Sit to Sidelying Rolling: Supervision Sidelying to sit: Supervision     Sit to sidelying: Supervision General bed mobility comments: no physical assistance  required, supervision to maintain log roll technique    Transfers Overall transfer level: Needs assistance Equipment used: None Transfers: Sit to/from Stand   Stand pivot transfers: Supervision         General transfer comment: sit to stand x 5 with no use of UEs. supervision for safety    Ambulation/Gait               General Gait Details: declined ambulation due to recently ambulating with mobility specialists   Stairs             Wheelchair Mobility    Modified Rankin (Stroke Patients Only)       Balance Overall balance assessment: Mild deficits observed, not formally tested                                          Cognition Arousal/Alertness: Awake/alert Behavior During Therapy: Anxious Overall Cognitive Status: No family/caregiver present to determine baseline cognitive functioning                                          Exercises General Exercises - Lower Extremity Hip Flexion/Marching: Both, 10 reps, Standing Toe Raises: Both, 5 reps, Standing Other Exercises Other Exercises: shoulder rolls due to patient complaining of neck and shoulder blade pain Other Exercises: sit to stand x 5 with no use of UE    General Comments        Pertinent Vitals/Pain Pain Assessment Pain Assessment: Faces Faces Pain Scale: Hurts whole lot Pain Location: low back Pain Descriptors / Indicators: Burning, Sharp Pain Intervention(s): Monitored during session  Home Living                          Prior Function            PT Goals (current goals can now be found in the care plan section) Acute Rehab PT Goals Patient Stated Goal: to reduce pain PT Goal Formulation: With patient Time For Goal Achievement: 02/24/22 Potential to Achieve Goals: Good Progress towards PT goals: Progressing toward goals    Frequency    Min 5X/week      PT Plan Current plan remains appropriate    Co-evaluation               AM-PAC PT "6 Clicks" Mobility   Outcome Measure  Help needed turning from your back to your side while in a flat bed without using bedrails?: A Little Help needed moving from lying on your back to sitting on the side of a flat bed without using bedrails?: A Little Help needed moving to and from a bed to a chair (including a wheelchair)?: A Little Help needed standing up from a chair using your arms (e.g., wheelchair or bedside chair)?: A Little Help needed to walk in hospital room?: A Little Help needed climbing 3-5 steps with a railing? : A Lot 6 Click Score: 17    End of Session   Activity Tolerance: Patient tolerated treatment well Patient left: in bed;with call bell/phone within reach Nurse Communication: Mobility status PT Visit Diagnosis: Unsteadiness on feet (R26.81);Muscle weakness (generalized) (M62.81);Other abnormalities of gait and mobility (R26.89)     Time: 3762-8315 PT Time Calculation (min) (ACUTE ONLY): 21 min  Charges:  $Therapeutic Exercise: 8-22 mins                     [name] A. Dan Humphreys PT, DPT Acute Rehabilitation Services Office [phone]    [name] 02/11/2022, 4:51 PM

## 2022-02-11 NOTE — Progress Notes (Addendum)
I have seen and examined the patient. I have personally reviewed the clinical findings, laboratory findings, microbiological data and imaging studies. The assessment and treatment plan was discussed with the Nurse Practitioner Jeanine LuzGregory Calone. I agree with her/his recommendations except following additions/corrections.  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 [place], [place] [address] Phone: [phone]  Fax: [phone]   [facility]  Date of Admission:  02/08/2022     Total days of antibiotics 4         ASSESSMENT:  Brittney Hunt is POD #2 from lumbar wound debridement and hardware revision. Surgical specimens growing Staphyloccus aureus with sensitivity pending. Blood cultures from 02/10/22 are without growth to date. TTE without evidence of vegetation. Suspect neck pain likely muscular at this point and will continue to monitor. Continue current dose of Cefazolin and continue to monitor blood cultures for clearance of bacteremia. Continue to hold PICC line placement until blood cultures are clear. Post-operative wound care per Neurosurgery.  PLAN:  Continue current dose of Cefazolin. Monitor cultures for clearance of bacteremia. Hold PICC line placement until blood cultures are clear.  Post-operative wound care per Neurosurgery.    Principal Problem:   Wound infection after surgery Active Problems:   MSSA bacteremia    amLODipine  10 mg Oral Daily   atorvastatin  20 mg Oral QHS   buPROPion  150 mg Oral Daily   docusate sodium  100 mg Oral BID   HYDROmorphone   Intravenous Q4H   hydrOXYzine  25 mg Oral q AM   And   hydrOXYzine  50 mg Oral QPM   methocarbamol  500 mg Oral Daily   mometasone-formoterol  2 puff Inhalation BID   mupirocin ointment   Nasal BID   pantoprazole  40 mg Oral Daily   primidone  100 mg Oral QHS   QUEtiapine  200 mg Oral QHS   sodium chloride flush  3 mL Intravenous Q12H   traZODone  300 mg Oral QHS    SUBJECTIVE:  Afebrile overnight with no acute events. Having neck stiffness and pain otherwise doing okay and pain controlled with PCA.    Allergies  Allergen Reactions   Chlorhexidine Itching    Can tolerate chg soap if rinsed off, does not tolerate wipes.     Review of Systems: Review of Systems  Constitutional:  Negative for chills, fever and weight loss.  Respiratory:  Negative for cough, shortness of breath and wheezing.   Cardiovascular:  Negative for chest pain and leg swelling.  Gastrointestinal:  Negative for abdominal pain, constipation, diarrhea, nausea and vomiting.  Musculoskeletal:  Positive for neck pain.  Skin:  Negative for rash.      OBJECTIVE: Vitals:   02/11/22 0806 02/11/22 0806 02/11/22 0814 02/11/22 1043  BP:  100/66  119/69  Pulse: 88 91  Resp: 19 18 13    Temp:  99 F (37.2 C)    TempSrc:  Oral    SpO2: 95% 98% 93%   Weight:      Height:       Body mass index is 39.5 kg/m.  Physical Exam Constitutional:      General: She is not in acute distress.    Appearance: She is well-developed.  Cardiovascular:     Rate and Rhythm: Normal rate and regular rhythm.     Heart sounds: Normal heart sounds.  Pulmonary:     Effort: Pulmonary effort is normal.     Breath sounds: Normal breath sounds.  Skin:    General: Skin is warm and dry.   Neurological:     Mental Status: She is alert and oriented to person, place, and time.  Psychiatric:        Behavior: Behavior normal.        Thought Content: Thought content normal.        Judgment: Judgment normal.     Lab Results Lab Results  Component Value Date   WBC 12.0 (H) 02/08/2022   HGB 10.8 (L) 02/08/2022   HCT 32.7 (L) 02/08/2022   MCV 85.2 02/08/2022   PLT 235 02/08/2022    Lab Results  Component Value Date   CREATININE 0.60 02/08/2022   BUN 8 02/08/2022   NA 131 (L) 02/08/2022   K 3.1 (L) 02/08/2022   CL 94 (L) 02/08/2022   CO2 25 02/08/2022    Lab Results  Component Value Date   ALT 17 01/01/2022   AST 17 01/01/2022   ALKPHOS 85 01/01/2022   BILITOT 0.5 01/01/2022     Microbiology: Recent Results (from the past 240 hour(s))  Resp Panel by RT-PCR (Flu A&B, Covid) Anterior Nasal Swab     Status: None   Collection Time: 02/08/22 10:29 AM   Specimen: Anterior Nasal Swab  Result Value Ref Range Status   SARS Coronavirus 2 by RT PCR NEGATIVE NEGATIVE Final    Comment: (NOTE) SARS-CoV-2 target nucleic acids are NOT DETECTED.  The SARS-CoV-2 RNA is generally detectable in upper respiratory specimens during the acute phase of infection. The lowest concentration of SARS-CoV-2 viral copies this assay can detect is 138 copies/mL. A negative result does not preclude SARS-Cov-2 infection and should not be used as the sole basis for treatment or other patient management decisions. A negative result may occur with  improper specimen collection/handling, submission of specimen other than nasopharyngeal swab, presence of viral mutation(s) within the areas targeted by this assay, and inadequate number of viral copies(<138 copies/mL). A negative result must be combined with clinical observations, patient history, and epidemiological information. The expected result is Negative.  Fact Sheet for Patients:  02/10/22  Fact Sheet  for Healthcare Providers:  BloggerCourse.com  This test is no t yet approved or cleared by the [place] FDA and  has been authorized for detection and/or diagnosis of SARS-CoV-2 by FDA under an Emergency Use Authorization (EUA). This EUA will remain  in effect (meaning this test can be used) for the duration of the COVID-19 declaration under Section 564(b)(1) of the Act, 21 U.S.C.section 360bbb-3(b)(1), unless the authorization is terminated  or revoked sooner.       Influenza A by PCR NEGATIVE NEGATIVE Final   Influenza B by PCR NEGATIVE NEGATIVE Final    Comment: (NOTE) The Xpert Xpress SARS-CoV-2/FLU/RSV plus assay is intended as an aid in the diagnosis of influenza from Nasopharyngeal  swab specimens and should not be used as a sole basis for treatment. Nasal washings and aspirates are unacceptable for Xpert Xpress SARS-CoV-2/FLU/RSV testing.  Fact Sheet for Patients: EntrepreneurPulse.com.au  Fact Sheet for Healthcare Providers: IncredibleEmployment.be  This test is not yet approved or cleared by the [place] FDA and has been authorized for detection and/or diagnosis of SARS-CoV-2 by FDA under an Emergency Use Authorization (EUA). This EUA will remain in effect (meaning this test can be used) for the duration of the COVID-19 declaration under Section 564(b)(1) of the Act, 21 U.S.C. section 360bbb-3(b)(1), unless the authorization is terminated or revoked.  Performed at [facility], [place] [address] [place], [place] [address]   Blood culture (routine x 2)     Status: Abnormal   Collection Time: 02/08/22 10:50 AM   Specimen: BLOOD  Result Value Ref Range Status   Specimen Description BLOOD SITE NOT SPECIFIED  Final   Special Requests   Final    BOTTLES DRAWN AEROBIC AND ANAEROBIC Blood Culture adequate volume   Culture  Setup Time   Final    GRAM POSITIVE COCCI IN CLUSTERS IN BOTH AEROBIC AND  ANAEROBIC BOTTLES CRITICAL RESULT CALLED TO, READ BACK BY AND VERIFIED WITH: V BRYK,PHARMD@0519  02/09/22 [place] Performed at [facility], [place] [address], [place], [place] [address]    Culture STAPHYLOCOCCUS AUREUS (A)  Final   Report Status 02/11/2022 FINAL  Final   Organism ID, Bacteria STAPHYLOCOCCUS AUREUS  Final      Susceptibility   Staphylococcus aureus - MIC*    CIPROFLOXACIN <=0.5 SENSITIVE Sensitive     ERYTHROMYCIN <=0.25 SENSITIVE Sensitive     GENTAMICIN <=0.5 SENSITIVE Sensitive     OXACILLIN 0.5 SENSITIVE Sensitive     TETRACYCLINE <=1 SENSITIVE Sensitive     VANCOMYCIN <=0.5 SENSITIVE Sensitive     TRIMETH/SULFA <=10 SENSITIVE Sensitive     CLINDAMYCIN <=0.25 SENSITIVE Sensitive     RIFAMPIN <=0.5 SENSITIVE Sensitive     Inducible Clindamycin NEGATIVE Sensitive     * STAPHYLOCOCCUS AUREUS  Blood Culture ID Panel (Reflexed)     Status: Abnormal   Collection Time: 02/08/22 10:50 AM  Result Value Ref Range Status   Enterococcus faecalis NOT DETECTED NOT DETECTED Final   Enterococcus Faecium NOT DETECTED NOT DETECTED Final   Listeria monocytogenes NOT DETECTED NOT DETECTED Final   Staphylococcus species DETECTED (A) NOT DETECTED Final    Comment: CRITICAL RESULT CALLED TO, READ BACK BY AND VERIFIED WITH: V BRYK,PHARMD@0520  02/09/22 [place]    Staphylococcus aureus (BCID) DETECTED (A) NOT DETECTED Final    Comment: CRITICAL RESULT CALLED TO, READ BACK BY AND VERIFIED WITH: V BRYK,PHARMD@0520  02/09/22 [place]    Staphylococcus epidermidis NOT DETECTED NOT DETECTED Final   Staphylococcus lugdunensis NOT DETECTED NOT DETECTED Final   Streptococcus species NOT DETECTED NOT DETECTED Final   Streptococcus agalactiae NOT DETECTED NOT DETECTED Final   Streptococcus pneumoniae NOT DETECTED NOT DETECTED Final   Streptococcus pyogenes NOT DETECTED NOT DETECTED Final   A.calcoaceticus-baumannii NOT DETECTED NOT DETECTED Final   Bacteroides fragilis NOT DETECTED NOT DETECTED Final    Enterobacterales NOT DETECTED NOT DETECTED Final   Enterobacter cloacae complex NOT DETECTED NOT DETECTED Final   Escherichia coli NOT DETECTED NOT DETECTED Final   Klebsiella aerogenes NOT DETECTED NOT DETECTED Final   Klebsiella oxytoca NOT DETECTED NOT DETECTED Final   Klebsiella pneumoniae NOT DETECTED NOT DETECTED Final   Proteus species NOT DETECTED NOT DETECTED Final   Salmonella  species NOT DETECTED NOT DETECTED Final   Serratia marcescens NOT DETECTED NOT DETECTED Final   Haemophilus influenzae NOT DETECTED NOT DETECTED Final   Neisseria meningitidis NOT DETECTED NOT DETECTED Final   Pseudomonas aeruginosa NOT DETECTED NOT DETECTED Final   Stenotrophomonas maltophilia NOT DETECTED NOT DETECTED Final   Candida albicans NOT DETECTED NOT DETECTED Final   Candida auris NOT DETECTED NOT DETECTED Final   Candida glabrata NOT DETECTED NOT DETECTED Final   Candida krusei NOT DETECTED NOT DETECTED Final   Candida parapsilosis NOT DETECTED NOT DETECTED Final   Candida tropicalis NOT DETECTED NOT DETECTED Final   Cryptococcus neoformans/gattii NOT DETECTED NOT DETECTED Final   Meth resistant mecA/C and MREJ NOT DETECTED NOT DETECTED Final    Comment: Performed at [facility], [address], [place], [place] [address]  Blood culture (routine x 2)     Status: Abnormal   Collection Time: 02/08/22  3:55 PM   Specimen: BLOOD RIGHT HAND  Result Value Ref Range Status   Specimen Description BLOOD RIGHT HAND  Final   Special Requests   Final    BOTTLES DRAWN AEROBIC AND ANAEROBIC Blood Culture adequate volume   Culture  Setup Time   Final    GRAM POSITIVE COCCI IN CLUSTERS IN BOTH AEROBIC AND ANAEROBIC BOTTLES CRITICAL RESULT CALLED TO, READ BACK BY AND VERIFIED WITH: PHARMD E SPEINBACK [id number] AT 957 AM BY CM    Culture (A)  Final    STAPHYLOCOCCUS AUREUS SUSCEPTIBILITIES PERFORMED ON PREVIOUS CULTURE WITHIN THE LAST 5 DAYS. Performed at [facility], [address]  [place], [place] [address]    Report Status 02/11/2022 FINAL  Final  Surgical pcr screen     Status: Abnormal   Collection Time: 02/09/22  6:07 AM   Specimen: Nasal Mucosa; Nasal Swab  Result Value Ref Range Status   MRSA, PCR NEGATIVE NEGATIVE Final   Staphylococcus aureus POSITIVE (A) NEGATIVE Final    Comment: (NOTE) The Xpert SA Assay (FDA approved for NASAL specimens in patients 86 years of age and older), is one component of a comprehensive surveillance program. It is not intended to diagnose infection nor to guide or monitor treatment. Performed at [facility], [address] [place], [place] [address]   Aerobic/Anaerobic Culture w Gram Stain (surgical/deep wound)     Status: None (Preliminary result)   Collection Time: 02/09/22  3:34 PM   Specimen: Wound  Result Value Ref Range Status   Specimen Description WOUND  Final   Special Requests   Final    LUMBAR PT ON ANCEF Performed at [facility], [address] [place], [place] [address]    Gram Stain PENDING  Incomplete   Culture FEW STAPHYLOCOCCUS AUREUS  Final   Report Status PENDING  Incomplete   Organism ID, Bacteria STAPHYLOCOCCUS AUREUS  Final      Susceptibility   Staphylococcus aureus - MIC*    CIPROFLOXACIN <=0.5 SENSITIVE Sensitive     ERYTHROMYCIN <=0.25 SENSITIVE Sensitive     GENTAMICIN <=0.5 SENSITIVE Sensitive     OXACILLIN 0.5 SENSITIVE Sensitive     TETRACYCLINE <=1 SENSITIVE Sensitive     VANCOMYCIN <=0.5 SENSITIVE Sensitive     TRIMETH/SULFA <=10 SENSITIVE Sensitive     CLINDAMYCIN <=0.25 SENSITIVE Sensitive     RIFAMPIN <=0.5 SENSITIVE Sensitive     Inducible Clindamycin NEGATIVE Sensitive     * FEW STAPHYLOCOCCUS AUREUS  Culture, blood (Routine X 2) w Reflex  to ID Panel     Status: None (Preliminary result)   Collection Time: 02/10/22  6:36 AM   Specimen: BLOOD RIGHT HAND  Result Value Ref Range Status   Specimen Description BLOOD RIGHT HAND  Final   Special Requests   Final     BOTTLES DRAWN AEROBIC AND ANAEROBIC Blood Culture adequate volume   Culture   Final    NO GROWTH < 24 HOURS Performed at [facility], [address], [place], [place] [address]    Report Status PENDING  Incomplete  Culture, blood (Routine X 2) w Reflex to ID Panel     Status: None (Preliminary result)   Collection Time: 02/10/22  6:42 AM   Specimen: BLOOD  Result Value Ref Range Status   Specimen Description BLOOD LEFT ANTECUBITAL  Final   Special Requests   Final    BOTTLES DRAWN AEROBIC AND ANAEROBIC Blood Culture adequate volume   Culture   Final    NO GROWTH < 24 HOURS Performed at [facility], [address] [place], [place] [address]    Report Status PENDING  Incomplete     Marcos Eke, NP [facility]  02/11/2022  10:49 AM

## 2022-02-11 NOTE — TOC Initial Note (Addendum)
Transition of Care [facility]) - Initial/Assessment Note    Patient Details  Name: Brittney Hunt MRN: [id number] Date of Birth: 1960/06/09  [facility] CM/SW Contact:    Kingsley Plan, RN Phone Number: 02/11/2022, 11:57 AM  Clinical Narrative:                 Spoke to patient at bedside. Discussed need for IV antibiotics at home.   PAtient will have a HHRN however HHRN will not be present everytime a dose is due.   [facility] company has a Charity fundraiser Pam who will come to the hospital prior to discharge and provide education to her and a support person. Patient lives with her two brothers and states her oldest brother will be willing to learn and assist her at home.   Bedside nurse will provide wound education to her and brother also prior to discharge. HHRN will assess wound but will not be able to make daily visits to do dressing change ( insurance).   PT/OT also recommending home health PT/OT and rollator. Patient in agreement. No preference.  Cory with [facility] checking to see if [facility] has staffing to accept.   Pam with [facility] following patient.   Rollator order entered. Will need to wait closer to discharge date to call [facility] to bring to room.   1545 Cory with [facility] can accept referral for home health. Pam with [facility] aware   Expected Discharge Plan: Home w Home Health Services Barriers to Discharge: Continued Medical Work up   Patient Goals and CMS Choice Patient states their goals for this hospitalization and ongoing recovery are:: to return to home CMS Medicare.gov Compare Post Acute Care list provided to:: Patient Choice offered to / list presented to : Patient  Expected Discharge Plan and Services Expected Discharge Plan: Home w Home Health Services   Discharge Planning Services: CM Consult Post Acute Care Choice: Durable Medical Equipment, Home Health Living arrangements for the past 2 months: Single Family Home                 DME Arranged:  Walker rolling with seat DME Agency: [facility]     Representative spoke with at DME Agency: will need to call closer to discharge HH Arranged: PT, OT, RN          Prior Living Arrangements/Services Living arrangements for the past 2 months: Single Family Home Lives with:: Siblings Patient language and need for interpreter reviewed:: Yes Do you feel safe going back to the place where you live?: Yes      Need for Family Participation in Patient Care: Yes (Comment) Care giver support system in place?: Yes (comment) Current home services: DME Criminal Activity/Legal Involvement Pertinent to Current Situation/Hospitalization: No - Comment as needed  Activities of Daily Living      Permission Sought/Granted   Permission granted to share information with : No              Emotional Assessment Appearance:: Appears stated age Attitude/Demeanor/Rapport: Engaged Affect (typically observed): Accepting Orientation: : Oriented to Situation, Oriented to  Time, Oriented to Place, Oriented to Self Alcohol / Substance Use: Not Applicable Psych Involvement: No (comment)  Admission diagnosis:  Wound dehiscence [T81.30XA] Wound infection after surgery [T81.49XA] Patient Active Problem List   Diagnosis Date Noted   MSSA bacteremia 02/09/2022   Wound infection after surgery 02/08/2022   Degenerative spondylolisthesis 01/04/2022   PCP:  Ellender Hose, NP Pharmacy:   [facility] [address]  [place] - [address] [place] [address] Phone: [phone] Fax: [phone]     Social Determinants of Health (SDOH) Interventions    Readmission Risk Interventions     No data to display

## 2022-02-11 NOTE — Care Management Important Message (Signed)
Important Message  Patient Details  Name: Brittney Hunt MRN: [id number] Date of Birth: Jul 25, 1960   Medicare Important Message Given:  Yes     Hannah Beat 02/11/2022, 11:06 AM

## 2022-02-12 DIAGNOSIS — T8149XA Infection following a procedure, other surgical site, initial encounter: Secondary | ICD-10-CM | POA: Diagnosis not present

## 2022-02-12 LAB — TROPONIN I (HIGH SENSITIVITY)
Troponin I (High Sensitivity): 4 ng/L (ref <18)
Troponin I (High Sensitivity): 4 ng/L (ref <18)

## 2022-02-12 LAB — COMPREHENSIVE METABOLIC PANEL
ALT: 31 U/L (ref 0–44)
AST: 27 U/L (ref 15–41)
Albumin: 2.3 g/dL — ABNORMAL LOW (ref 3.5–5.0)
Alkaline Phosphatase: 98 U/L (ref 38–126)
Anion gap: 11 (ref 5–15)
BUN: 6 mg/dL — ABNORMAL LOW (ref 8–23)
CO2: 23 mmol/L (ref 22–32)
Calcium: 8.9 mg/dL (ref 8.9–10.3)
Chloride: 96 mmol/L — ABNORMAL LOW (ref 98–111)
Creatinine, Ser: 0.55 mg/dL (ref 0.44–1.00)
GFR, Estimated: >60 mL/min (ref >60)
Glucose, Bld: 134 mg/dL — ABNORMAL HIGH (ref 70–99)
Potassium: 3.3 mmol/L — ABNORMAL LOW (ref 3.5–5.1)
Sodium: 130 mmol/L — ABNORMAL LOW (ref 135–145)
Total Bilirubin: 0.2 mg/dL — ABNORMAL LOW (ref 0.3–1.2)
Total Protein: 6.6 g/dL (ref 6.5–8.1)

## 2022-02-12 LAB — CBC
HCT: 28.8 % — ABNORMAL LOW (ref 36.0–46.0)
Hemoglobin: 9.6 g/dL — ABNORMAL LOW (ref 12.0–15.0)
MCH: 28.7 pg (ref 26.0–34.0)
MCHC: 33.3 g/dL (ref 30.0–36.0)
MCV: 86.2 fL (ref 80.0–100.0)
Platelets: 233 10*3/uL (ref 150–400)
RBC: 3.34 MIL/uL — ABNORMAL LOW (ref 3.87–5.11)
RDW: 13.8 % (ref 11.5–15.5)
WBC: 7.8 10*3/uL (ref 4.0–10.5)
nRBC: 0 % (ref 0.0–0.2)

## 2022-02-12 MED ORDER — CEFAZOLIN IV (FOR PTA / DISCHARGE USE ONLY): 2.0000 g | Freq: Three times a day (TID) | INTRAVENOUS | 0 refills | Status: DC

## 2022-02-12 MED ORDER — RIFAMPIN 300 MG PO CAPS
300.0000 mg | ORAL_CAPSULE | Freq: Two times a day (BID) | ORAL | Status: DC
  Administered 2022-02-12 – 2022-02-15 (×7): 300 mg via ORAL
  Filled 2022-02-12 (×8): qty 1

## 2022-02-12 MED ORDER — RIFAMPIN 300 MG PO CAPS: 300.0000 mg | ORAL_CAPSULE | Freq: Two times a day (BID) | ORAL | 0 refills | Status: DC

## 2022-02-12 MED ORDER — POTASSIUM CHLORIDE IN NACL 20-0.9 MEQ/L-% IV SOLN
INTRAVENOUS | Status: AC
  Filled 2022-02-12: qty 1000

## 2022-02-12 NOTE — Progress Notes (Signed)
Mobility Specialist - Progress Note   02/12/22 1453  Mobility  Activity Refused mobility   Pt refused mobility stating she had a bad night and is trying to recover from it. Will follow up if time permits.  Larey Seat

## 2022-02-12 NOTE — Progress Notes (Signed)
Patient currently declining CPAP for the night.

## 2022-02-12 NOTE — TOC Progression Note (Addendum)
Transition of Care [facility]) - Progression Note    Patient Details  Name: Brittney Hunt MRN: [id number] Date of Birth: [birth year]  Transition of Care [facility]) CM/SW Contact  Jacalyn Lefevre Edson Snowball, RN Phone Number: 02/12/2022, 12:01 PM  Clinical Narrative:     Hulen Skains for Rollator   Alvis Lemmings and [facility] following.   [facility] cannot see patient at home until Monday . Secure chatted Viona Gilmore   Expected Discharge Plan: [place] Barriers to Discharge: Continued Medical Work up  Expected Discharge Plan and Services Expected Discharge Plan: [place]   Discharge Planning Services: CM Consult Post Acute Care Choice: Durable Medical Equipment, Home Health Living arrangements for the past 2 months: Single Family Home                 DME Arranged: Walker rolling with seat DME Agency: [facility]     Representative spoke with at DME Agency: will need to call closer to discharge [place] Arranged: PT, OT, RN           Social Determinants of Health (SDOH) Interventions    Readmission Risk Interventions     No data to display

## 2022-02-12 NOTE — TOC Progression Note (Signed)
Transition of Care [facility]) - Progression Note    Patient Details  Name: Brittney Hunt MRN: [id number] Date of Birth: August 19, 1960  [facility] CM/SW Contact  Apolonia [place], Edson Snowball, RN Phone Number: 02/12/2022, 3:14 PM  Clinical Narrative:     Jeannene Patella with [facility] will provide education today around 1630 pm   [facility] start of care is Monday.   Expected Discharge Plan: [place] Barriers to Discharge: Continued Medical Work up  Expected Discharge Plan and Services Expected Discharge Plan: [place]   Discharge Planning Services: CM Consult Post Acute Care Choice: Durable Medical Equipment, Home Health Living arrangements for the past 2 months: Single Family Home                 DME Arranged: Walker rolling with seat DME Agency: [facility]     Representative spoke with at DME Agency: will need to call closer to discharge [place] Arranged: PT, OT, RN           Social Determinants of Health (SDOH) Interventions    Readmission Risk Interventions     No data to display

## 2022-02-12 NOTE — Plan of Care (Signed)
  Problem: Education: Goal: Ability to verbalize activity precautions or restrictions will improve Outcome: Progressing Goal: Knowledge of the prescribed therapeutic regimen will improve Outcome: Progressing   Problem: Activity: Goal: Ability to avoid complications of mobility impairment will improve Outcome: Progressing Goal: Ability to tolerate increased activity will improve Outcome: Progressing Goal: Will remain free from falls Outcome: Progressing   

## 2022-02-12 NOTE — Progress Notes (Addendum)
I have seen and examined the patient. I have personally reviewed the clinical findings, laboratory findings, microbiological data and imaging studies. The assessment and treatment plan was discussed with the Nurse Practitioner Jeanine Luz. I agree with her/his recommendations except following additions/corrections.  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 No significant DDI noted on review of her med list with ID pharmacy except may need some adjustments in her psych meds ( to be followed with Psych). Liver enzymes OK.   Plan to continue 6 weeks of abtx from date of negative blood cx on 10/18 if they stay negative  Monitor CBC and CMP    Fu in ID clinic will be arranged   Dr Daiva Eves covering this weekend and will follow blood cultures peripherally. Please call with active questions       Brittney Fraction, MD Infectious Disease Physician [facility] [address] [place], [place] [address] Phone: [phone]  Fax: [phone]   [facility]  Date of Admission:  02/08/2022     Total days of antibiotics 5         ASSESSMENT:  [name] is POD #3 from lumbar wound debridement and hardware revision complicated by MSSA bacteremia with blood cultures from 02/10/22 remaining without growth to date.  Appears to be tolerating Cefazolin with no adverse side effects. Neurosurgery managing her pain. If blood cultures remain clear in the next 24 hours okay for PICC line placement as she  will need 6 weeks of IV antibiotics followed by suppression. Will add rifampin in the setting of Staphylococcus aureus infection and presence or hardware. May need to have Seroquel and Wellbutrin adjusted while taking Rifampin. Continue current dose of Cefazolin and monitor cultures for clearance of bacteremia. Post-operative wound care per Neurosurgery.   PLAN:  Continue Cefazolin Start rifampin Monitor blood cultures for clearance of bacteremia.  If cultures remain negative in the next 24 hours okay for PICC placement.  Post operative management per Neurosurgery.    Dr. Daiva Eves will be available over the weekend for any ID related questions or concerns.   Principal Problem:   Wound infection after surgery Active Problems:   MSSA bacteremia    amLODipine  10 mg Oral Daily   atorvastatin  20 mg Oral QHS   buPROPion  150 mg Oral Daily   docusate sodium  100 mg Oral BID   hydrOXYzine  25 mg Oral q AM   And   hydrOXYzine  50 mg Oral QPM   mometasone-formoterol  2 puff Inhalation BID   mupirocin ointment   Nasal BID   oxyCODONE  15 mg Oral Q12H   pantoprazole  40 mg Oral Daily   primidone  100 mg Oral QHS   QUEtiapine  200 mg Oral QHS   rifampin  300 mg Oral Q12H   sodium chloride flush  3 mL Intravenous Q12H   tiZANidine  4 mg Oral TID   traZODone  300 mg Oral QHS    SUBJECTIVE:  Afebrile overnight with no acute events. Continues to have back and neck pain. Did not sleep well last night and feeling tired.   Allergies  Allergen Reactions   Chlorhexidine Itching    Can tolerate chg soap if rinsed off, does not tolerate wipes.     Review of Systems: Review of Systems  Constitutional:  Negative for chills, fever and weight loss.  Respiratory:  Negative for cough, shortness of breath and wheezing.   Cardiovascular:  Negative for chest pain and leg swelling.  Gastrointestinal:  Negative for abdominal pain, constipation, diarrhea, nausea and vomiting.  Musculoskeletal:   Positive for back pain and neck pain.  Skin:  Negative for rash.      OBJECTIVE: Vitals:   02/12/22 0010 02/12/22 0412 02/12/22 0814 02/12/22 0827  BP: (!) 138/55 (!) 150/77  126/71  Pulse: 85 88 85 86  Resp: 16 18 18 17   Temp: 98.6 F (37 C) 98.1 F (36.7 C)  98.4 F (36.9 C)  TempSrc: Oral Oral  Oral  SpO2: 100% 100% 99% 96%  Weight:      Height:       Body mass index is 39.5 kg/m.  Physical Exam Constitutional:      General: She is not in acute distress.    Appearance: She is well-developed.  Cardiovascular:     Rate and Rhythm: Normal rate and regular rhythm.     Heart sounds: Normal heart sounds.  Pulmonary:     Effort: Pulmonary effort is normal.     Breath sounds: Normal breath sounds.  Skin:    General: Skin is warm and dry.  Neurological:     Mental Status: She is alert and oriented to person, place, and time.  Psychiatric:        Behavior: Behavior normal.        Thought Content: Thought content normal.        Judgment: Judgment normal.     Lab Results Lab Results  Component Value Date   WBC 7.8 02/12/2022   HGB 9.6 (L) 02/12/2022   HCT 28.8 (L) 02/12/2022   MCV 86.2 02/12/2022   PLT 233 02/12/2022    Lab Results  Component Value Date   CREATININE 0.55 02/12/2022   BUN 6 (L) 02/12/2022   NA 130 (L) 02/12/2022   K 3.3 (L) 02/12/2022   CL 96 (L) 02/12/2022   CO2 23 02/12/2022    Lab Results  Component Value Date   ALT 31 02/12/2022   AST 27 02/12/2022   ALKPHOS 98 02/12/2022   BILITOT 0.2 (L) 02/12/2022     Microbiology: Recent Results (from the past 240 hour(s))  Resp Panel by RT-PCR (Flu A&B, Covid) Anterior Nasal Swab     Status: None   Collection Time: 02/08/22 10:29 AM   Specimen: Anterior Nasal Swab  Result Value Ref Range Status   SARS Coronavirus 2 by RT PCR NEGATIVE NEGATIVE Final    Comment: (NOTE) SARS-CoV-2 target nucleic acids are NOT DETECTED.  The SARS-CoV-2 RNA is generally detectable in upper  respiratory specimens during the acute phase of infection. The lowest concentration of SARS-CoV-2 viral copies this assay can detect is 138 copies/mL. A negative result does not preclude SARS-Cov-2 infection and should not be used as the sole basis for treatment or other patient management decisions. A negative result may occur with  improper specimen collection/handling, submission of specimen other than nasopharyngeal swab, presence of viral mutation(s) within the areas targeted by this assay, and inadequate number  of viral copies(<138 copies/mL). A negative result must be combined with clinical observations, patient history, and epidemiological information. The expected result is Negative.  Fact Sheet for Patients:  BloggerCourse.com  Fact Sheet for Healthcare Providers:  SeriousBroker.it  This test is no t yet approved or cleared by the [place] FDA and  has been authorized for detection and/or diagnosis of SARS-CoV-2 by FDA under an Emergency Use Authorization (EUA). This EUA will remain  in effect (meaning this test can be used) for the duration of the COVID-19 declaration under Section 564(b)(1) of the Act, 21 U.S.C.section 360bbb-3(b)(1), unless the authorization is terminated  or revoked sooner.       Influenza A by PCR NEGATIVE NEGATIVE Final   Influenza B by PCR NEGATIVE NEGATIVE Final    Comment: (NOTE) The Xpert Xpress SARS-CoV-2/FLU/RSV plus assay is intended as an aid in the diagnosis of influenza from Nasopharyngeal swab specimens and should not be used as a sole basis for treatment. Nasal washings and aspirates are unacceptable for Xpert Xpress SARS-CoV-2/FLU/RSV testing.  Fact Sheet for Patients: BloggerCourse.com  Fact Sheet for Healthcare Providers: SeriousBroker.it  This test is not yet approved or cleared by the [place] FDA and has been  authorized for detection and/or diagnosis of SARS-CoV-2 by FDA under an Emergency Use Authorization (EUA). This EUA will remain in effect (meaning this test can be used) for the duration of the COVID-19 declaration under Section 564(b)(1) of the Act, 21 U.S.C. section 360bbb-3(b)(1), unless the authorization is terminated or revoked.  Performed at [facility], [address], [place], [place] [address]   Blood culture (routine x 2)     Status: Abnormal   Collection Time: 02/08/22 10:50 AM   Specimen: BLOOD  Result Value Ref Range Status   Specimen Description BLOOD SITE NOT SPECIFIED  Final   Special Requests   Final    BOTTLES DRAWN AEROBIC AND ANAEROBIC Blood Culture adequate volume   Culture  Setup Time   Final    GRAM POSITIVE COCCI IN CLUSTERS IN BOTH AEROBIC AND ANAEROBIC BOTTLES CRITICAL RESULT CALLED TO, READ BACK BY AND VERIFIED WITH: V BRYK,PHARMD@0519  02/09/22 MK Performed at [facility], [address] [place], [place] [address]    Culture STAPHYLOCOCCUS AUREUS (A)  Final   Report Status 02/11/2022 FINAL  Final   Organism ID, Bacteria STAPHYLOCOCCUS AUREUS  Final      Susceptibility   Staphylococcus aureus - MIC*    CIPROFLOXACIN <=0.5 SENSITIVE Sensitive     ERYTHROMYCIN <=0.25 SENSITIVE Sensitive     GENTAMICIN <=0.5 SENSITIVE Sensitive     OXACILLIN 0.5 SENSITIVE Sensitive     TETRACYCLINE <=1 SENSITIVE Sensitive     VANCOMYCIN <=0.5 SENSITIVE Sensitive     TRIMETH/SULFA <=10 SENSITIVE Sensitive     CLINDAMYCIN <=0.25 SENSITIVE Sensitive     RIFAMPIN <=0.5 SENSITIVE Sensitive     Inducible Clindamycin NEGATIVE Sensitive     * STAPHYLOCOCCUS AUREUS  Blood Culture ID Panel (Reflexed)     Status: Abnormal   Collection Time: 02/08/22 10:50 AM  Result Value Ref Range Status   Enterococcus faecalis NOT DETECTED NOT DETECTED Final   Enterococcus Faecium NOT DETECTED NOT DETECTED Final   Listeria monocytogenes NOT DETECTED NOT DETECTED Final    Staphylococcus species DETECTED (A) NOT DETECTED Final    Comment: CRITICAL RESULT CALLED TO, READ BACK BY AND VERIFIED WITH: V BRYK,PHARMD@0520  02/09/22 MK    Staphylococcus aureus (BCID) DETECTED (A) NOT DETECTED Final  Comment: CRITICAL RESULT CALLED TO, READ BACK BY AND VERIFIED WITH: V BRYK,PHARMD@0520  02/09/22 [place]    Staphylococcus epidermidis NOT DETECTED NOT DETECTED Final   Staphylococcus lugdunensis NOT DETECTED NOT DETECTED Final   Streptococcus species NOT DETECTED NOT DETECTED Final   Streptococcus agalactiae NOT DETECTED NOT DETECTED Final   Streptococcus pneumoniae NOT DETECTED NOT DETECTED Final   Streptococcus pyogenes NOT DETECTED NOT DETECTED Final   A.calcoaceticus-baumannii NOT DETECTED NOT DETECTED Final   Bacteroides fragilis NOT DETECTED NOT DETECTED Final   Enterobacterales NOT DETECTED NOT DETECTED Final   Enterobacter cloacae complex NOT DETECTED NOT DETECTED Final   Escherichia coli NOT DETECTED NOT DETECTED Final   Klebsiella aerogenes NOT DETECTED NOT DETECTED Final   Klebsiella oxytoca NOT DETECTED NOT DETECTED Final   Klebsiella pneumoniae NOT DETECTED NOT DETECTED Final   Proteus species NOT DETECTED NOT DETECTED Final   Salmonella species NOT DETECTED NOT DETECTED Final   Serratia marcescens NOT DETECTED NOT DETECTED Final   Haemophilus influenzae NOT DETECTED NOT DETECTED Final   Neisseria meningitidis NOT DETECTED NOT DETECTED Final   Pseudomonas aeruginosa NOT DETECTED NOT DETECTED Final   Stenotrophomonas maltophilia NOT DETECTED NOT DETECTED Final   Candida albicans NOT DETECTED NOT DETECTED Final   Candida auris NOT DETECTED NOT DETECTED Final   Candida glabrata NOT DETECTED NOT DETECTED Final   Candida krusei NOT DETECTED NOT DETECTED Final   Candida parapsilosis NOT DETECTED NOT DETECTED Final   Candida tropicalis NOT DETECTED NOT DETECTED Final   Cryptococcus neoformans/gattii NOT DETECTED NOT DETECTED Final   Meth resistant mecA/C and  MREJ NOT DETECTED NOT DETECTED Final    Comment: Performed at [facility], [address], [place], [place] [address]  Blood culture (routine x 2)     Status: Abnormal   Collection Time: 02/08/22  3:55 PM   Specimen: BLOOD RIGHT HAND  Result Value Ref Range Status   Specimen Description BLOOD RIGHT HAND  Final   Special Requests   Final    BOTTLES DRAWN AEROBIC AND ANAEROBIC Blood Culture adequate volume   Culture  Setup Time   Final    GRAM POSITIVE COCCI IN CLUSTERS IN BOTH AEROBIC AND ANAEROBIC BOTTLES CRITICAL RESULT CALLED TO, READ BACK BY AND VERIFIED WITH: PHARMD E Smock [id number] AT 35 AM BY CM    Culture (A)  Final    STAPHYLOCOCCUS AUREUS SUSCEPTIBILITIES PERFORMED ON PREVIOUS CULTURE WITHIN THE LAST 5 DAYS. Performed at [facility], [place] [address] [place], [place] [address]    Report Status 02/11/2022 FINAL  Final  Surgical pcr screen     Status: Abnormal   Collection Time: 02/09/22  6:07 AM   Specimen: Nasal Mucosa; Nasal Swab  Result Value Ref Range Status   MRSA, PCR NEGATIVE NEGATIVE Final   Staphylococcus aureus POSITIVE (A) NEGATIVE Final    Comment: (NOTE) The Xpert SA Assay (FDA approved for NASAL specimens in patients 29 years of age and older), is one component of a comprehensive surveillance program. It is not intended to diagnose infection nor to guide or monitor treatment. Performed at [facility], [place] [address] [place], [place] [address]   Aerobic/Anaerobic Culture w Gram Stain (surgical/deep wound)     Status: None (Preliminary result)   Collection Time: 02/09/22  3:34 PM   Specimen: Wound  Result Value Ref Range Status   Specimen Description WOUND  Final   Special Requests LUMBAR PT ON ANCEF  Final   Gram  Stain   Final    MODERATE WBC PRESENT, PREDOMINANTLY PMN FEW GRAM POSITIVE COCCI IN CLUSTERS Performed at [facility], [address] [place], [place] [address]    Culture   Final    FEW STAPHYLOCOCCUS  AUREUS NO ANAEROBES ISOLATED; CULTURE IN PROGRESS FOR 5 DAYS    Report Status PENDING  Incomplete   Organism ID, Bacteria STAPHYLOCOCCUS AUREUS  Final      Susceptibility   Staphylococcus aureus - MIC*    CIPROFLOXACIN <=0.5 SENSITIVE Sensitive     ERYTHROMYCIN <=0.25 SENSITIVE Sensitive     GENTAMICIN <=0.5 SENSITIVE Sensitive     OXACILLIN 0.5 SENSITIVE Sensitive     TETRACYCLINE <=1 SENSITIVE Sensitive     VANCOMYCIN <=0.5 SENSITIVE Sensitive     TRIMETH/SULFA <=10 SENSITIVE Sensitive     CLINDAMYCIN <=0.25 SENSITIVE Sensitive     RIFAMPIN <=0.5 SENSITIVE Sensitive     Inducible Clindamycin NEGATIVE Sensitive     * FEW STAPHYLOCOCCUS AUREUS  Culture, blood (Routine X 2) w Reflex to ID Panel     Status: None (Preliminary result)   Collection Time: 02/10/22  6:36 AM   Specimen: BLOOD RIGHT HAND  Result Value Ref Range Status   Specimen Description BLOOD RIGHT HAND  Final   Special Requests   Final    BOTTLES DRAWN AEROBIC AND ANAEROBIC Blood Culture adequate volume   Culture   Final    NO GROWTH 2 DAYS Performed at [facility], [address] [place], [place] [address]    Report Status PENDING  Incomplete  Culture, blood (Routine X 2) w Reflex to ID Panel     Status: None (Preliminary result)   Collection Time: 02/10/22  6:42 AM   Specimen: BLOOD  Result Value Ref Range Status   Specimen Description BLOOD LEFT ANTECUBITAL  Final   Special Requests   Final    BOTTLES DRAWN AEROBIC AND ANAEROBIC Blood Culture adequate volume   Culture   Final    NO GROWTH 2 DAYS Performed at [facility], [address] [place], [place] [address]    Report Status PENDING  Incomplete     Marcos EkeGreg Calone, NP [facility]  02/12/2022  10:22 AM

## 2022-02-12 NOTE — Progress Notes (Signed)
Patient c/o of sharp chest pain on a pain scale of 8/10,difficulty breathing.anxious,irritable tearful,stated I thought I can't catch my breath. EGK,done,vital signs done,oxygen applied New orders received from on call provider,will continue to monitor.

## 2022-02-12 NOTE — Progress Notes (Signed)
Patient declined CPAP for the night  

## 2022-02-12 NOTE — Progress Notes (Signed)
PT Cancellation Note  Patient Details Name: Brittney Hunt MRN: [id number] DOB: 1960/06/02   Cancelled Treatment:    Reason Eval/Treat Not Completed: Other (comment).  Declining to walk and get ex done, will retry as time and pt allows.   Ramond Dial 02/12/2022, 2:54 PM  Mee Hives, PT PhD Acute Rehab Dept. Number: [place]

## 2022-02-12 NOTE — Progress Notes (Signed)
Providing Compassionate, Quality Care - Together   Subjective: Patient is resting comfortably upon entering the room. Reports back pain when awakened.  Objective: Vital signs in last 24 hours: Temp:  [98.1 F (36.7 C)-99.1 F (37.3 C)] 98.4 F (36.9 C) (10/20 0827) Pulse Rate:  [81-96] 86 (10/20 0827) Resp:  [16-20] 17 (10/20 0827) BP: (119-150)/(55-87) 126/71 (10/20 0827) SpO2:  [96 %-100 %] 96 % (10/20 0827)  Intake/Output from previous day: 10/19 0701 - 10/20 0700 In: 600 [P.O.:600] Out: 670 [Urine:600; Drains:70] Intake/Output this shift: No intake/output data recorded.  Alert and oriented x 4 PERRLA CN II-XII grossly intact MAE, Strength and sensation intact Incision is covered with gauze dressing and Dermabond; Dressing is dry and intact, with a small amount of dried sanguinous drainage  Lab Results: Recent Labs    02/12/22 0006  WBC 7.8  HGB 9.6*  HCT 28.8*  PLT 233   BMET Recent Labs    02/12/22 0006  NA 130*  K 3.3*  CL 96*  CO2 23  GLUCOSE 134*  BUN 6*  CREATININE 0.55  CALCIUM 8.9    Studies/Results: DG CHEST PORT 1 VIEW  Result Date: 02/11/2022 CLINICAL DATA:  Pain short of breath EXAM: PORTABLE CHEST 1 VIEW COMPARISON:  02/08/2022 FINDINGS: The heart size and mediastinal contours are within normal limits. Both lungs are clear. The visualized skeletal structures are unremarkable. IMPRESSION: No active disease. Electronically Signed   By: Donavan Foil M.D.   On: 02/11/2022 23:52   ECHOCARDIOGRAM COMPLETE  Result Date: 02/10/2022    ECHOCARDIOGRAM REPORT   Patient Name:   Brittney Hunt Date of Exam: 02/10/2022 Medical Rec #:  [id number]    Height:       66.5 in Accession #:    [id number]   Weight:       248.5 lb Date of Birth:  [birth year]    BSA:          2.205 m Patient Age:    61 years     BP:           147/102 mmHg Patient Gender: F            HR:           86 bpm. Exam Location:  Inpatient Procedure: 2D Echo, Cardiac Doppler and Color  Doppler Indications:    R78.81 Bacteremia  History:        Patient has no prior history of Echocardiogram examinations.                 Risk Factors:Hypertension and Diabetes. Wound infection after                 surgery.  Sonographer:    Alvino Chapel RCS Referring Phys: [id number] [place]  1. Left ventricular ejection fraction, by estimation, is 60 to 65%. The left ventricle has normal function. The left ventricle has no regional wall motion abnormalities. Left ventricular diastolic parameters were normal.  2. Right ventricular systolic function is normal. The right ventricular size is normal.  3. Left atrial size was mildly dilated.  4. The mitral valve is normal in structure. Trivial mitral valve regurgitation. No evidence of mitral stenosis.  5. The aortic valve is normal in structure. Aortic valve regurgitation is not visualized. Aortic valve sclerosis/calcification is present, without any evidence of aortic stenosis.  6. The inferior vena cava is normal in size with greater than 50% respiratory variability, suggesting right atrial pressure of 3 mmHg. FINDINGS  Left Ventricle: Left ventricular ejection fraction, by estimation, is 60 to 65%. The left ventricle has normal function. The left ventricle has no regional wall motion abnormalities. The left ventricular internal cavity size was normal in size. There is  no left ventricular hypertrophy. Left ventricular diastolic parameters were normal. Right Ventricle: The right ventricular size is normal. No increase in right ventricular wall thickness. Right ventricular systolic function is normal. Left Atrium: Left atrial size was mildly dilated. Right Atrium: Right atrial size was normal in size. Pericardium: There is no evidence of pericardial effusion. Mitral Valve: The mitral valve is normal in structure. Mild mitral annular calcification. Trivial mitral valve regurgitation. No evidence of mitral valve stenosis. Tricuspid Valve: The tricuspid  valve is normal in structure. Tricuspid valve regurgitation is trivial. No evidence of tricuspid stenosis. Aortic Valve: The aortic valve is normal in structure. Aortic valve regurgitation is not visualized. Aortic valve sclerosis/calcification is present, without any evidence of aortic stenosis. Aortic valve mean gradient measures 8.0 mmHg. Aortic valve peak  gradient measures 16.6 mmHg. Aortic valve area, by VTI measures 2.60 cm. Pulmonic Valve: The pulmonic valve was normal in structure. Pulmonic valve regurgitation is not visualized. No evidence of pulmonic stenosis. Aorta: The aortic root is normal in size and structure. Venous: The inferior vena cava is normal in size with greater than 50% respiratory variability, suggesting right atrial pressure of 3 mmHg. IAS/Shunts: No atrial level shunt detected by color flow Doppler.  LEFT VENTRICLE PLAX 2D LVIDd:         5.20 cm   Diastology LVIDs:         3.30 cm   LV e' medial:    7.72 cm/s LV PW:         1.00 cm   LV E/e' medial:  18.3 LV IVS:        0.80 cm   LV e' lateral:   9.03 cm/s LVOT diam:     2.00 cm   LV E/e' lateral: 15.6 LV SV:         112 LV SV Index:   51 LVOT Area:     3.14 cm  RIGHT VENTRICLE RV S prime:     13.80 cm/s TAPSE (M-mode): 2.4 cm LEFT ATRIUM             Index        RIGHT ATRIUM           Index LA diam:        3.80 cm 1.72 cm/m   RA Area:     20.00 cm LA Vol (A2C):   92.8 ml 42.08 ml/m  RA Volume:   53.60 ml  24.31 ml/m LA Vol (A4C):   58.4 ml 26.48 ml/m LA Biplane Vol: 75.1 ml 34.05 ml/m  AORTIC VALVE AV Area (Vmax):    2.22 cm AV Area (Vmean):   2.19 cm AV Area (VTI):     2.60 cm AV Vmax:           204.00 cm/s AV Vmean:          136.000 cm/s AV VTI:            0.430 m AV Peak Grad:      16.6 mmHg AV Mean Grad:      8.0 mmHg LVOT Vmax:         144.00 cm/s LVOT Vmean:        95.000 cm/s LVOT VTI:  0.356 m LVOT/AV VTI ratio: 0.83  AORTA Ao Root diam: 3.20 cm MITRAL VALVE MV Area (PHT): 3.08 cm     SHUNTS MV Decel Time:  246 msec     Systemic VTI:  0.36 m MV E velocity: 141.00 cm/s  Systemic Diam: 2.00 cm MV A velocity: 125.00 cm/s MV E/A ratio:  1.13 Glori Bickers MD Electronically signed by Glori Bickers MD Signature Date/Time: 02/10/2022/4:50:07 PM    Final     Assessment/Plan: Patient underwent lumbar wound debridement with revision of lumbar hardware on 02/09/2022 by Dr. Zada Finders. Her initial blood cultures from 02/08/2022 and surgical specimen from 02/09/2022 grew Staphylococcus aureus. Blood cultures from 02/10/2022 have no growth to date. Patient is on IV cefazolin.   LOS: 4 days   -Once blood cultures are clear, PICC line can be placed. Patient is medically ready to discharge home once PICC line is placed and patient/support person are educated on administering the IV antibiotic. -Continue to encourage mobilization.    Viona Gilmore, DNP, AGNP-C Nurse Practitioner  [facility] [place] [address], [place], [place] [address] P: [phone]    F: [phone]  02/12/2022, 9:38 AM

## 2022-02-12 NOTE — Progress Notes (Signed)
PHARMACY CONSULT NOTE FOR:  OUTPATIENT  PARENTERAL ANTIBIOTIC THERAPY (OPAT)  Indication: MSSA lumbar wound infection w/ hardware Regimen: cefazolin 2g IV q8 hours and rifampin 300 mg po BID End date: 03/23/2022  IV antibiotic discharge orders are pended. To discharging provider:  please sign these orders via discharge navigator,  Select New Orders & click on the button choice - Manage This Unsigned Work.     Thank you for allowing pharmacy to be a part of this patient's care.  Louanne Belton, PharmD, [facility] PGY1 Pharmacy Resident 02/12/2022 10:36 AM

## 2022-02-13 LAB — BASIC METABOLIC PANEL
Anion gap: 10 (ref 5–15)
BUN: <5 mg/dL — ABNORMAL LOW (ref 8–23)
CO2: 25 mmol/L (ref 22–32)
Calcium: 9.2 mg/dL (ref 8.9–10.3)
Chloride: 99 mmol/L (ref 98–111)
Creatinine, Ser: 0.47 mg/dL (ref 0.44–1.00)
GFR, Estimated: >60 mL/min (ref >60)
Glucose, Bld: 148 mg/dL — ABNORMAL HIGH (ref 70–99)
Potassium: 3.3 mmol/L — ABNORMAL LOW (ref 3.5–5.1)
Sodium: 134 mmol/L — ABNORMAL LOW (ref 135–145)

## 2022-02-13 LAB — CBC
HCT: 30.8 % — ABNORMAL LOW (ref 36.0–46.0)
Hemoglobin: 9.9 g/dL — ABNORMAL LOW (ref 12.0–15.0)
MCH: 28 pg (ref 26.0–34.0)
MCHC: 32.1 g/dL (ref 30.0–36.0)
MCV: 87 fL (ref 80.0–100.0)
Platelets: 246 10*3/uL (ref 150–400)
RBC: 3.54 MIL/uL — ABNORMAL LOW (ref 3.87–5.11)
RDW: 13.8 % (ref 11.5–15.5)
WBC: 6 10*3/uL (ref 4.0–10.5)
nRBC: 0 % (ref 0.0–0.2)

## 2022-02-13 NOTE — Progress Notes (Signed)
   Providing Compassionate, Quality Care - Together  NEUROSURGERY PROGRESS NOTE   S: No issues overnight.  Complains of worsening low back pain.  O: EXAM:  BP 112/77 (BP Location: Left Arm)   Pulse 85   Temp 98.3 F (36.8 C) (Oral)   Resp 15   Ht 5' 6.5" (1.689 m)   Wt 112.7 kg   SpO2 99%   BMI 39.50 kg/m   Awake, alert, oriented x3 PERRL Speech fluent, appropriate  CNs grossly intact  Moves all extremities equally Dressing intact, JP not holding suction  ASSESSMENT:  61 y.o. female with   Lumbar wound infection, status post incision and debridement and revision of hardware  PLAN: -PICC line once blood cultures negative -Continue pain control -Discontinue JP    Thank you for allowing me to participate in this patient's care.  Please do not hesitate to call with questions or concerns.   Elwin Sleight, [facility] Cell: [phone]

## 2022-02-13 NOTE — Progress Notes (Signed)
Patient refused dulera inhaler. Patient says she does not take the dulera inhaler and wants to take her albuterol inhaler that she has from home. Patient also refused CPAP machine for the night.

## 2022-02-14 LAB — AEROBIC/ANAEROBIC CULTURE W GRAM STAIN (SURGICAL/DEEP WOUND)

## 2022-02-14 NOTE — Progress Notes (Signed)
  NEUROSURGERY PROGRESS NOTE   No issues overnight. Pt with minimal low-back pain, c/o some interscapular pain.  EXAM:  BP (!) 131/57 (BP Location: Right Arm)   Pulse 80   Temp 98.2 F (36.8 C) (Oral)   Resp 15   Ht 5' 6.5" (1.689 m)   Wt 112.7 kg   SpO2 90%   BMI 39.50 kg/m   Awake, alert, oriented  Speech fluent, appropriate  CN grossly intact  5/5 BUE/BLE  Wound clean, minimal drainage  IMPRESSION:  61 y.o. female POD# 5 wound debridement after lumbar fusion. Non-toxic, on IV abx. Blood cx (-) x4 days, wound cx (+) S. Aureus pan-sensitive.  PLAN: - Cont ancef/rifampin per ID - PICC line when negative blood cultures finalized   Consuella Lose, MD [facility]

## 2022-02-14 NOTE — Progress Notes (Signed)
Patient did well today, IV's blew x3. IV team started new IV due to abx due today but otherwise she is having PICC placed tomorrow potentially. IV and oral pain meds given

## 2022-02-14 NOTE — Progress Notes (Signed)
Mobility Specialist: Progress Note   02/14/22 1431  Mobility  Activity Ambulated with assistance in hallway  Level of Assistance Contact guard assist, steadying assist  Assistive Device Four wheel walker  Distance Ambulated (ft) 300 ft  Activity Response Tolerated well  Mobility Referral Yes  $Mobility charge 1 Mobility   Pt received in the bed and agreeable to mobility. MinA with bed mobility and contact guard during ambulation. C/o back pain after returning to the room when getting back to bed, resolved once supine. Pt back in bed with call bell and phone in reach.   [place] Sahaana Weitman Mobility Specialist Secure Chat Only

## 2022-02-15 ENCOUNTER — Inpatient Hospital Stay: Payer: Self-pay

## 2022-02-15 DIAGNOSIS — B9561 Methicillin susceptible Staphylococcus aureus infection as the cause of diseases classified elsewhere: Secondary | ICD-10-CM

## 2022-02-15 DIAGNOSIS — R7881 Bacteremia: Secondary | ICD-10-CM

## 2022-02-15 LAB — CULTURE, BLOOD (ROUTINE X 2)
Culture: NO GROWTH
Culture: NO GROWTH
Special Requests: ADEQUATE
Special Requests: ADEQUATE

## 2022-02-15 MED ORDER — SODIUM CHLORIDE 0.9% FLUSH: 10.0000 mL | INTRAVENOUS | Status: DC | PRN

## 2022-02-15 MED ORDER — OXYCODONE HCL 5 MG PO TABS: 5.0000 mg | ORAL_TABLET | Freq: Four times a day (QID) | ORAL | 0 refills | Status: DC | PRN

## 2022-02-15 MED ORDER — OXYCODONE HCL ER 15 MG PO T12A: 15.0000 mg | EXTENDED_RELEASE_TABLET | Freq: Two times a day (BID) | ORAL | 0 refills | Status: DC

## 2022-02-15 MED ORDER — TIZANIDINE HCL 4 MG PO TABS: 4.0000 mg | ORAL_TABLET | Freq: Three times a day (TID) | ORAL | 0 refills | Status: DC

## 2022-02-15 MED ORDER — DOCUSATE SODIUM 100 MG PO CAPS: 100.0000 mg | ORAL_CAPSULE | Freq: Two times a day (BID) | ORAL | 0 refills | Status: DC

## 2022-02-15 NOTE — Discharge Instructions (Signed)
Wound Care Do not put any creams, lotions, or ointments on incision. You are fine to shower. Let water run over incision and pat dry.  Activity Walk each and every day, increasing distance each day. No lifting greater than 5 lbs.  Avoid excessive back motion. No driving for 2 weeks; may ride as a passenger locally.  Diet Resume your normal diet.   Return to Work Will be discussed at your follow up appointment.  Call Your Doctor If Any of These Occur Redness, drainage, or swelling at the wound.  Temperature greater than 101 degrees. Severe pain not relieved by pain medication. Incision starts to come apart.  Follow Up Appt Call [phone] today for appointment in 2-3 weeks if you don't already have one or for any problems.

## 2022-02-15 NOTE — Progress Notes (Signed)
Peripherally Inserted Central Catheter Placement  The IV Nurse has discussed with the patient and/or persons authorized to consent for the patient, the purpose of this procedure and the potential benefits and risks involved with this procedure.  The benefits include less needle sticks, lab draws from the catheter, and the patient may be discharged home with the catheter. Risks include, but not limited to, infection, bleeding, blood clot (thrombus formation), and puncture of an artery; nerve damage and irregular heartbeat and possibility to perform a PICC exchange if needed/ordered by physician.  Alternatives to this procedure were also discussed.  Bard Power PICC patient education guide, fact sheet on infection prevention and patient information card has been provided to patient /or left at bedside.    PICC Placement Documentation  PICC Single Lumen 47/42/59 Left Basilic 43 cm 1 cm (Active)  Indication for Insertion or Continuance of Line Prolonged intravenous therapies;Home intravenous therapies (PICC only) 02/15/22 1301  Exposed Catheter (cm) 1 cm 02/15/22 1301  Site Assessment Clean, Dry, Intact 02/15/22 1301  Line Status Flushed;Saline locked;Blood return noted 02/15/22 1301  Dressing Type Transparent;Securing device 02/15/22 1301  Dressing Status Antimicrobial disc in place;Clean, Dry, Intact 02/15/22 1301  Safety Lock Not Applicable 56/38/75 6433  Line Care Connections checked and tightened 02/15/22 1301  Dressing Intervention New dressing 02/15/22 1301  Dressing Change Due 02/22/22 02/15/22 1301       Milbert Bixler, Nicolette Bang 02/15/2022, 1:02 PM

## 2022-02-15 NOTE — Discharge Summary (Signed)
Physician Discharge Summary     Providing Compassionate, Quality Care - Together   Patient ID: Brittney Hunt MRN: [id number] DOB/AGE: [birth year] 61 y.o.  Admit date: 02/08/2022 Discharge date: 02/15/2022  Admission Diagnoses: Wound infection after surgery  Discharge Diagnoses:  Principal Problem:   Wound infection after surgery Active Problems:   MSSA bacteremia   Discharged Condition: good  Hospital Course: Patient underwent an I and D of her surgical wound by Dr. Zada Finders on 02/09/2022. She was admitted to [phone]  following recovery from anesthesia in the PACU. Her postoperative course was complicated by pain control. She has worked with both physical and occupational therapies who feel the patient is ready for discharge home with Home Health therapies. She is ambulating with assistance and the aid of a walker. She is tolerating a normal diet. She is not having any bowel or bladder dysfunction. Her pain is reasonably controlled with oral pain medication. She is ready for discharge home.   Consults: ID and rehabilitation medicine  Significant Diagnostic Studies: labs:  Results for orders placed or performed during the hospital encounter of 02/08/22 (from the past 72 hour(s))  Basic metabolic panel     Status: Abnormal   Collection Time: 02/13/22  2:43 AM  Result Value Ref Range   Sodium 134 (L) 135 - 145 mmol/L   Potassium 3.3 (L) 3.5 - 5.1 mmol/L   Chloride 99 98 - 111 mmol/L   CO2 25 22 - 32 mmol/L   Glucose, Bld 148 (H) 70 - 99 mg/dL    Comment: Glucose reference range applies only to samples taken after fasting for at least 8 hours.   BUN <5 (L) 8 - 23 mg/dL   Creatinine, Ser 0.47 0.44 - 1.00 mg/dL   Calcium 9.2 8.9 - 10.3 mg/dL   GFR, Estimated >60 >60 mL/min    Comment: (NOTE) Calculated using the CKD-EPI Creatinine Equation (2021)    Anion gap 10 5 - 15    Comment: Performed at [facility] [address], [place] [address]  CBC     Status:  Abnormal   Collection Time: 02/13/22  2:43 AM  Result Value Ref Range   WBC 6.0 4.0 - 10.5 K/uL   RBC 3.54 (L) 3.87 - 5.11 MIL/uL   Hemoglobin 9.9 (L) 12.0 - 15.0 g/dL   HCT 30.8 (L) 36.0 - 46.0 %   MCV 87.0 80.0 - 100.0 fL   MCH 28.0 26.0 - 34.0 pg   MCHC 32.1 30.0 - 36.0 g/dL   RDW 13.8 11.5 - 15.5 %   Platelets 246 150 - 400 K/uL   nRBC 0.0 0.0 - 0.2 %    Comment: Performed at [facility], [place] [address], [place], [place] [address]    and radiology: [place] EKG SITE RITE  Result Date: 02/15/2022 If Site Rite image not attached, placement could not be confirmed due to current cardiac rhythm.  DG CHEST PORT 1 VIEW  Result Date: 02/11/2022 CLINICAL DATA:  Pain short of breath EXAM: PORTABLE CHEST 1 VIEW COMPARISON:  02/08/2022 FINDINGS: The heart size and mediastinal contours are within normal limits. Both lungs are clear. The visualized skeletal structures are unremarkable. IMPRESSION: No active disease. Electronically Signed   By: Donavan Foil M.D.   On: 02/11/2022 23:52     Treatments: surgery: Lumbar wound washout, replacement of bilateral L4 pedicle screws   Discharge Exam: Blood pressure (!) 116/93, pulse 92, temperature 98.2 F (36.8 C), temperature source Oral, resp.  rate 17, height 5' 6.5" (1.689 m), weight 112.7 kg, SpO2 100 %.  Alert and oriented x 4 PERRLA CN II-XII grossly intact MAE, Strength and sensation intact Incision is covered with gauze dressing and Dermabond; Dressing is dry and intact, with a small amount of dried sanguinous drainage  Disposition: Discharge disposition: 01-Home or Self Care       Discharge Instructions     [facility] pharmacist to adjust dose for Vancomycin, Aminoglycosides and other anti-infective therapies as requested by physician.   Complete by: As directed    [facility] to provide Cath Flo 62m   Complete by: As directed    Administer for PICC line occlusion and as ordered by physician for other  access device issues.   Anaphylaxis Kit: Provided to treat any anaphylactic reaction to the medication being provided to the patient if First Dose or when requested by physician   Complete by: As directed    Epinephrine 116mml vial / amp: Administer 0.59m559m0.59ml22mubcutaneously once for moderate to severe anaphylaxis, nurse to call physician and pharmacy when reaction occurs and call 911 if needed for immediate care   Diphenhydramine 50mg9mIV vial: Administer 25-50mg 20mM PRN for first dose reaction, rash, itching, mild reaction, nurse to call physician and pharmacy when reaction occurs   Sodium Chloride 0.9% NS 500ml I359mdminister if needed for hypovolemic blood pressure drop or as ordered by physician after call to physician with anaphylactic reaction   Call MD for:  difficulty breathing, headache or visual disturbances   Complete by: As directed    Call MD for:  persistant nausea and vomiting   Complete by: As directed    Call MD for:  redness, tenderness, or signs of infection (pain, swelling, redness, odor or green/yellow discharge around incision site)   Complete by: As directed    Call MD for:  severe uncontrolled pain   Complete by: As directed    Call MD for:  temperature >100.4   Complete by: As directed    Change dressing on IV access line weekly and PRN   Complete by: As directed    Diet - low sodium heart healthy   Complete by: As directed    Face-to-face encounter (required for Medicare/Medicaid patients)   Complete by: As directed    I Kyri Dai Patricia Nettley that this patient is under my care and that I, or a nurse practitioner or physician's assistant working with me, had a face-to-face encounter that meets the physician face-to-face encounter requirements with this patient on 02/15/2022. The encounter with the patient was in whole, or in part for the following medical condition(s) which is the primary reason for home health care (List medical condition): Degenerative  spondylolisthesis   The encounter with the patient was in whole, or in part, for the following medical condition, which is the primary reason for home health care: Degenerative spondylolisthesis   I certify that, based on my findings, the following services are medically necessary home health services: Physical therapy   Reason for Medically Necessary Home Health Services: Therapy- Therapeutic Exercises to Increase Strength and Endurance   My clinical findings support the need for the above services: Unable to leave home safely without assistance and/or assistive device   Further, I certify that my clinical findings support that this patient is homebound due to: Unable to leave home safely without assistance   Flush IV access with Sodium Chloride 0.9% and Heparin 10 units/ml or 100 units/ml  Complete by: As directed    Home Health   Complete by: As directed    To provide the following care/treatments:  PT OT     Home infusion instructions - [facility]   Complete by: As directed    Instructions: Flush IV access with Sodium Chloride 0.9% and Heparin 10units/ml or 100units/ml   Change dressing on IV access line: Weekly and PRN   Instructions Cath Flo $Remove'2mg'qdxdEhB$ : Administer for PICC Line occlusion and as ordered by physician for other access device   [facility] pharmacist to adjust dose for: Vancomycin, Aminoglycosides and other anti-infective therapies as requested by physician   Increase activity slowly   Complete by: As directed    Method of administration may be changed at the discretion of home infusion pharmacist based upon assessment of the patient and/or caregiver's ability to self-administer the medication ordered   Complete by: As directed    No dressing needed   Complete by: As directed    No wound care   Complete by: As directed       Allergies as of 02/15/2022       Reactions   Chlorhexidine Itching   Can tolerate chg soap if rinsed off, does not tolerate  wipes.        Medication List     STOP taking these medications    amoxicillin 500 MG capsule Commonly known as: AMOXIL   cephALEXin 500 MG capsule Commonly known as: KEFLEX   cyclobenzaprine 10 MG tablet Commonly known as: FLEXERIL   meloxicam 15 MG tablet Commonly known as: MOBIC   methocarbamol 500 MG tablet Commonly known as: ROBAXIN   Oxycodone HCl 10 MG Tabs Replaced by: oxyCODONE 15 mg 12 hr tablet   oxyCODONE-acetaminophen 10-325 MG tablet Commonly known as: PERCOCET       TAKE these medications    acetaminophen 325 MG tablet Commonly known as: TYLENOL Take 650 mg by mouth every 6 (six) hours as needed for moderate pain or headache.   albuterol 108 (90 Base) MCG/ACT inhaler Commonly known as: VENTOLIN HFA Inhale 1 puff into the lungs every 6 (six) hours as needed for shortness of breath or wheezing.   amLODipine 10 MG tablet Commonly known as: NORVASC Take 10 mg by mouth daily.   aspirin EC 81 MG tablet Take 81 mg by mouth daily.   atorvastatin 20 MG tablet Commonly known as: LIPITOR Take 20 mg by mouth at bedtime.   buPROPion 150 MG 12 hr tablet Commonly known as: WELLBUTRIN SR Take 150 mg by mouth daily.   ceFAZolin  IVPB Commonly known as: ANCEF Inject 2 g into the vein every 8 (eight) hours. Indication:  MSSA lumbar wound infection w/ hardware First Dose: Yes Last Day of Therapy:  03/23/2022 Labs - Once weekly:  CBC/D and CMET, Labs - Every other week:  ESR and CRP Method of administration: IV Push Method of administration may be changed at the discretion of home infusion pharmacist based upon assessment of the patient and/or caregiver's ability to self-administer the medication ordered.   docusate sodium 100 MG capsule Commonly known as: COLACE Take 1 capsule (100 mg total) by mouth 2 (two) times daily.   Dulera 200-5 MCG/ACT Aero Generic drug: mometasone-formoterol Inhale 2 puffs into the lungs 2 (two) times daily.    gabapentin 600 MG tablet Commonly known as: NEURONTIN Take 600 mg by mouth 3 (three) times daily.   hydrOXYzine 25 MG tablet Commonly known as: ATARAX Take 25-50  mg by mouth See admin instructions. Take 25 mg by mouth in the morning and 50 mg at bedtime   NON FORMULARY Pt uses a cpap nightly   Omega-3 1000 MG Caps Take 1,000 mg by mouth daily.   omeprazole 40 MG capsule Commonly known as: PRILOSEC Take 40 mg by mouth 2 (two) times daily.   oxyCODONE 15 mg 12 hr tablet Commonly known as: OXYCONTIN Take 1 tablet (15 mg total) by mouth every 12 (twelve) hours. Replaces: Oxycodone HCl 10 MG Tabs   oxyCODONE 5 MG immediate release tablet Commonly known as: Oxy IR/ROXICODONE Take 1 tablet (5 mg total) by mouth every 6 (six) hours as needed for breakthrough pain ((score 4 to 6)).   phentermine 37.5 MG tablet Commonly known as: ADIPEX-P Take 37.5 mg by mouth daily before breakfast.   primidone 50 MG tablet Commonly known as: MYSOLINE Take 100 mg by mouth at bedtime.   QUEtiapine 200 MG tablet Commonly known as: SEROQUEL Take 200 mg by mouth at bedtime.   rifampin 300 MG capsule Commonly known as: Rifadin Take 1 capsule (300 mg total) by mouth 2 (two) times daily. End date: 03/23/2022   tiZANidine 4 MG tablet Commonly known as: ZANAFLEX Take 1 tablet (4 mg total) by mouth 3 (three) times daily.   traZODone 150 MG tablet Commonly known as: DESYREL Take 300 mg by mouth at bedtime.   triamcinolone cream 0.1 % Commonly known as: KENALOG Apply 1 application  topically daily as needed (itching).   VITAMIN D3 PO Take 1 tablet by mouth daily.   Womens Multivitamin Tabs Take 1 tablet by mouth daily.               Durable Medical Equipment  (From admission, onward)           Start     Ordered   02/11/22 1203  For home use only DME 4 wheeled rolling walker with seat  Once       Question:  Patient needs a walker to treat with the following condition  Answer:   Weakness   02/11/22 1202              Discharge Care Instructions  (From admission, onward)           Start     Ordered   02/15/22 0000  No dressing needed        02/15/22 1359   02/12/22 0000  Change dressing on IV access line weekly and PRN  (Home infusion instructions - [facility] )        02/12/22 1215            Follow-up Information     [facility] Follow up.   Specialty: Home Health Services Contact information: [place] [address] [phone]         Earnie Larsson, MD. Schedule an appointment as soon as possible for a visit in 2 week(s).   Specialty: Neurosurgery Contact information: [address] [place] [address] [phone]                 Signed: Viona Gilmore, DNP, AGNP-C Nurse Practitioner  [facility] [place] [address], [place], [place] [address] P: [phone]    F: [phone]  02/15/2022, 2:03 PM

## 2022-02-15 NOTE — TOC Progression Note (Addendum)
Transition of Care [facility]) - Progression Note    Patient Details  Name: Brittney Hunt MRN: [id number] Date of Birth: 17-Mar-1961  [facility] CM/SW Contact  Jacalyn Lefevre Edson Snowball, RN Phone Number: 02/15/2022, 1:02 PM  Clinical Narrative:     Potential discharge to home today.   Updated Pam with [facility] , meds were delivered to her home Saturday . Pam spoke to patient's brother this morning.   Updated Tommi Rumps with [facility]   Expected Discharge Plan: [place] Barriers to Discharge: Continued Medical Work up  Expected Discharge Plan and Services Expected Discharge Plan: [place]   Discharge Planning Services: CM Consult Post Acute Care Choice: Durable Medical Equipment, Home Health Living arrangements for the past 2 months: Single Family Home                 DME Arranged: Walker rolling with seat DME Agency: [employer]     Representative spoke with at DME Agency: will need to call closer to discharge [place] Arranged: PT, OT, RN           Social Determinants of Health (SDOH) Interventions    Readmission Risk Interventions     No data to display

## 2022-02-15 NOTE — Progress Notes (Signed)
[facility] Infectious Disease  Date of Admission:  02/08/2022           Reason for visit: Follow up on lumbar wound infection  Current antibiotics: Cefazolin Rifampin   ASSESSMENT:    61 y.o. 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  Admission blood cultures from 02/08/2022 notable for MSSA as well as found in her surgical cultures.  Repeat blood cultures obtained 10/18 no growth and finalized today.  RECOMMENDATIONS:    Continue cefazolin and rifampin Place PICC line See OPAT note below   Diagnosis: Lumbar spine infection and bacteremia  Culture Result: MSSA  Allergies  Allergen Reactions   Chlorhexidine Itching    Can tolerate chg soap if rinsed off, does not tolerate wipes.    OPAT Orders Discharge antibiotics to be given via PICC line Discharge antibiotics: Per pharmacy protocol  Cefazolin 2gm q8h Rifampin $RemoveBefo'300mg'QBazNwzukvL$  q12h   Duration: 6 weeks  End Date: 03/24/22  [facility] Care Per Protocol:  Home health RN for IV administration and teaching; PICC line care and labs.    Labs weekly while on IV antibiotics: __xx CBC with differential __ BMP __xx CMP __xx CRP __xx ESR __ Vancomycin trough __ CK  __xx Please pull PIC at completion of IV antibiotics __ Please leave PIC in place until doctor has seen patient or been notified  Fax weekly labs to [phone]  Clinic Follow Up Appt: 02/26/22 with Dr West Bali at 11:15am    Principal Problem:   Wound infection after surgery Active Problems:   MSSA bacteremia    MEDICATIONS:    Scheduled Meds:  amLODipine  10 mg Oral Daily   atorvastatin  20 mg Oral QHS   buPROPion  150 mg Oral Daily   docusate sodium  100 mg Oral BID   hydrOXYzine  25 mg Oral q AM   And   hydrOXYzine  50 mg Oral QPM   mometasone-formoterol  2 puff  Inhalation BID   mupirocin ointment   Nasal BID   oxyCODONE  15 mg Oral Q12H   pantoprazole  40 mg Oral Daily   primidone  100 mg Oral QHS   QUEtiapine  200 mg Oral QHS   rifampin  300 mg Oral Q12H   sodium chloride flush  3 mL Intravenous Q12H   tiZANidine  4 mg Oral TID   traZODone  300 mg Oral QHS   Continuous Infusions:  sodium chloride      ceFAZolin (ANCEF) IV 2 g (02/15/22 0628)   PRN Meds:.acetaminophen **OR** acetaminophen, albuterol, HYDROmorphone (DILAUDID) injection, menthol-cetylpyridinium **OR** phenol, Muscle Rub, ondansetron **OR** ondansetron (ZOFRAN) IV, oxyCODONE, polyethylene glycol, sodium chloride flush, triamcinolone cream  SUBJECTIVE:   24 hour events:  No new events   No complaints.  Review of Systems  Constitutional: Negative.   Musculoskeletal:  Positive for back pain.  All other systems reviewed and are negative.     OBJECTIVE:   Blood pressure (!) 116/93, pulse 92, temperature 98.2 F (36.8 C), temperature source Oral, resp. rate 17, height 5' 6.5" (1.689 m), weight 112.7 kg, SpO2 100 %. Body mass index is 39.5 kg/m.  Physical Exam Constitutional:      Appearance: She is well-developed.  Eyes:     Extraocular Movements: Extraocular movements intact.     Conjunctiva/sclera: Conjunctivae normal.  Abdominal:     General: There  is no distension.     Palpations: Abdomen is soft.  Musculoskeletal:     Cervical back: Normal range of motion and neck supple.  Skin:    General: Skin is warm and dry.  Neurological:     General: No focal deficit present.     Mental Status: She is alert and oriented to person, place, and time.  Psychiatric:        Mood and Affect: Mood normal.        Behavior: Behavior normal.      Lab Results: Lab Results  Component Value Date   WBC 6.0 02/13/2022   HGB 9.9 (L) 02/13/2022   HCT 30.8 (L) 02/13/2022   MCV 87.0 02/13/2022   PLT 246 02/13/2022    Lab Results  Component Value Date   NA 134 (L)  02/13/2022   K 3.3 (L) 02/13/2022   CO2 25 02/13/2022   GLUCOSE 148 (H) 02/13/2022   BUN <5 (L) 02/13/2022   CREATININE 0.47 02/13/2022   CALCIUM 9.2 02/13/2022   GFRNONAA >60 02/13/2022    Lab Results  Component Value Date   ALT 31 02/12/2022   AST 27 02/12/2022   ALKPHOS 98 02/12/2022   BILITOT 0.2 (L) 02/12/2022    No results found for: "CRP"  No results found for: "ESRSEDRATE"   I have reviewed the micro and lab results in Epic.  Imaging: [place] EKG SITE RITE  Result Date: 02/15/2022 If Site Rite image not attached, placement could not be confirmed due to current cardiac rhythm.    Imaging independently reviewed in Epic.    [name] for Infectious Disease [facility] [phone] pager 02/15/2022, 11:27 AM  I have personally spent 50 minutes involved in face-to-face and non-face-to-face activities for this patient on the day of the visit. Professional time spent includes the following activities: Preparing to see the patient (review of tests), Obtaining and/or reviewing separately obtained history (admission/discharge record), Performing a medically appropriate examination and/or evaluation , Ordering medications/tests/procedures, referring and communicating with other health care professionals, Documenting clinical information in the EMR, Independently interpreting results (not separately reported), Communicating results to the patient/family/caregiver, Counseling and educating the patient/family/caregiver and Care coordination (not separately reported).

## 2022-02-26 ENCOUNTER — Inpatient Hospital Stay: Payer: Medicare Other | Admitting: Infectious Diseases

## 2022-03-16 ENCOUNTER — Ambulatory Visit (INDEPENDENT_AMBULATORY_CARE_PROVIDER_SITE_OTHER): Payer: Medicare Other | Admitting: Infectious Disease

## 2022-03-16 ENCOUNTER — Ambulatory Visit (INDEPENDENT_AMBULATORY_CARE_PROVIDER_SITE_OTHER): Payer: Medicare Other

## 2022-03-16 ENCOUNTER — Telehealth: Payer: Self-pay

## 2022-03-16 ENCOUNTER — Encounter: Payer: Self-pay | Admitting: Infectious Disease

## 2022-03-16 ENCOUNTER — Other Ambulatory Visit: Payer: Self-pay

## 2022-03-16 ENCOUNTER — Inpatient Hospital Stay: Payer: Medicare Other | Admitting: Infectious Diseases

## 2022-03-16 VITALS — BP 143/83 | HR 87 | Temp 98.2°F | Wt 240.0 lb

## 2022-03-16 DIAGNOSIS — R7881 Bacteremia: Secondary | ICD-10-CM | POA: Diagnosis not present

## 2022-03-16 DIAGNOSIS — Z7185 Encounter for immunization safety counseling: Secondary | ICD-10-CM

## 2022-03-16 DIAGNOSIS — Z23 Encounter for immunization: Secondary | ICD-10-CM

## 2022-03-16 DIAGNOSIS — M4626 Osteomyelitis of vertebra, lumbar region: Secondary | ICD-10-CM

## 2022-03-16 DIAGNOSIS — B9561 Methicillin susceptible Staphylococcus aureus infection as the cause of diseases classified elsewhere: Secondary | ICD-10-CM

## 2022-03-16 HISTORY — DX: Osteomyelitis of vertebra, lumbar region: M46.26

## 2022-03-16 MED ORDER — CEFADROXIL 500 MG PO CAPS: 1000.0000 mg | ORAL_CAPSULE | Freq: Two times a day (BID) | ORAL | 11 refills | Status: DC

## 2022-03-16 NOTE — Patient Instructions (Signed)
Followup with Dr. Elinor Parkinson or myself

## 2022-03-16 NOTE — Telephone Encounter (Signed)
Called patient to see if she would be able to make it today's appointment, no answer. Left HIPAA compliant voicemail requesting callback.   Sandie Ano, RN

## 2022-03-16 NOTE — Progress Notes (Signed)
Subjective:  Chief complaint follow-up for lumbar wound infection and MSSA bacteremia  Patient ID: Brittney Hunt, female    DOB: 19-Jun-1960, 61 y.o.   MRN: [id number]  HPI   61 year old woman who previously had an L4-5/5-1 decompression and PSIF who presented with purulent drainage and worsening back pain.  Along with MSSA bacteremia CT showed loosening of the L4 screws, Dr. Venetia Constable took her to the operating room for washout and revision.The patient's prior incision was opened with copious purulence down to the epidural space. This was cultured, evacuated, and copiously irrigated. Infected material was debrided, sutures were removed, and the wound edges were debrided of necrotic material. The hardware was dissected bilaterally and the L4 screws were grossly loose and removed with fingertips without requiring a driver. These were then replaced with upsized screws bilaterally with improved purchase. The rod was replaced, new caps were placed x6.   She has been on cefazolin set to complete on March 24, 2022.  She has also been on rifampin given concern that the new hardware is infected.  She is having great deal of itching associated with her PICC line dressing and is wanting to know if she could have the PICC line removed.  She also is having trouble tolerating rifampin which is making her nauseous and also causing diarrhea.  I think she can have her IV antibiotics stopped as long as we are transitioning over to oral antibiotics with cefadroxil.  Back pain is minimal at present and typically only there when she tries to bend over which she is trying to avoid.  And she is wearing a brace neurosurgery gave her.      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    Social History   Socioeconomic History   Marital status: Divorced    Spouse name: Not on file   Number of children: 2   Years of education: Not on file   Highest education level: Not on file  Occupational History   Not on file  Tobacco Use   Smoking status: Every Day    Packs/day: 0.25    Years: 40.00    Total pack years: 10.00    Types: Cigarettes   Smokeless tobacco: Never  Vaping Use   Vaping Use: Never used  Substance and Sexual Activity   Alcohol use: Never   Drug use: Yes    Types: Marijuana   Sexual activity: Not on file  Other Topics Concern   Not on file  Social History Narrative   Not on file   Social Determinants of  Health   Financial Resource Strain: Not on file  Food Insecurity: Not on file  Transportation Needs: Not on file  Physical Activity: Not on file  Stress: Not on file  Social Connections: Not on file    Allergies  Allergen Reactions   Chlorhexidine Itching    Can tolerate chg soap if rinsed off, does not tolerate wipes.     Current Outpatient Medications:    acetaminophen (TYLENOL) 325 MG tablet, Take 650 mg by mouth every 6 (six) hours as needed for moderate pain or headache., Disp: , Rfl:    albuterol (VENTOLIN HFA) 108 (90 Base) MCG/ACT inhaler, Inhale 1 puff into the lungs every 6 (six) hours as needed for shortness of breath or wheezing., Disp: , Rfl:    amLODipine (NORVASC) 10 MG tablet, Take 10 mg by mouth daily., Disp: , Rfl:     aspirin 81 MG EC tablet, Take 81 mg by mouth daily., Disp: , Rfl:    atorvastatin (LIPITOR) 20 MG tablet, Take 20 mg by mouth at bedtime., Disp: , Rfl:    buPROPion (WELLBUTRIN SR) 150 MG 12 hr tablet, Take 150 mg by mouth daily., Disp: , Rfl:    ceFAZolin (ANCEF) IVPB, Inject 2 g into the vein every 8 (eight) hours. Indication:  MSSA lumbar wound infection w/ hardware First Dose: Yes Last Day of Therapy:  03/23/2022 Labs - Once weekly:  CBC/D and CMET, Labs - Every other week:  ESR and CRP Method of administration: IV Push Method of administration may be changed at the discretion of home infusion pharmacist based upon assessment of the patient and/or caregiver's ability to self-administer the medication ordered., Disp: 117 Units, Rfl: 0   Cholecalciferol (VITAMIN D3 PO), Take 1 tablet by mouth daily., Disp: , Rfl:    docusate sodium (COLACE) 100 MG capsule, Take 1 capsule (100 mg total) by mouth 2 (two) times daily., Disp: 10 capsule, Rfl: 0   gabapentin (NEURONTIN) 600 MG tablet, Take 600 mg by mouth 3 (three) times daily. (Patient not taking: Reported on 02/08/2022), Disp: , Rfl:    hydrOXYzine (ATARAX) 25 MG tablet, Take 25-50 mg by mouth See admin instructions. Take 25 mg by mouth in the morning and 50 mg at bedtime, Disp: , Rfl:    mometasone-formoterol (DULERA) 200-5 MCG/ACT AERO, Inhale 2 puffs into the lungs 2 (two) times daily., Disp: , Rfl:    Multiple Vitamins-Minerals (WOMENS MULTIVITAMIN) TABS, Take 1 tablet by mouth daily., Disp: , Rfl:    NON FORMULARY, Pt uses a cpap nightly, Disp: , Rfl:    Omega-3 1000 MG CAPS, Take 1,000 mg by mouth daily., Disp: , Rfl:    omeprazole (PRILOSEC) 40 MG capsule, Take 40 mg by mouth 2 (two) times daily., Disp: , Rfl:    oxyCODONE (OXY IR/ROXICODONE) 5 MG immediate release tablet, Take 1 tablet (5 mg total) by mouth every 6 (six) hours as needed for breakthrough pain ((score 4 to 6))., Disp: 20 tablet, Rfl: 0   oxyCODONE (OXYCONTIN) 15 mg 12 hr tablet,  Take 1 tablet (15 mg total) by mouth every 12 (twelve) hours., Disp: 14 tablet, Rfl: 0   phentermine (ADIPEX-P) 37.5 MG tablet, Take 37.5 mg by mouth daily before breakfast. (Patient not taking: Reported on 02/08/2022), Disp: , Rfl:    primidone (MYSOLINE) 50 MG tablet, Take 100 mg by mouth at bedtime., Disp: , Rfl:    QUEtiapine (SEROQUEL) 200 MG tablet, Take 200 mg by mouth at bedtime., Disp: , Rfl:  rifampin (RIFADIN) 300 MG capsule, Take 1 capsule (300 mg total) by mouth 2 (two) times daily. End date: 03/23/2022, Disp: 78 capsule, Rfl: 0   tiZANidine (ZANAFLEX) 4 MG tablet, Take 1 tablet (4 mg total) by mouth 3 (three) times daily., Disp: 30 tablet, Rfl: 0   traZODone (DESYREL) 150 MG tablet, Take 300 mg by mouth at bedtime., Disp: , Rfl:    triamcinolone cream (KENALOG) 0.1 %, Apply 1 application  topically daily as needed (itching)., Disp: , Rfl:    Review of Systems  Constitutional:  Negative for activity change, appetite change, chills, diaphoresis, fatigue, fever and unexpected weight change.  HENT:  Negative for congestion, rhinorrhea, sinus pressure, sneezing, sore throat and trouble swallowing.   Eyes:  Negative for photophobia and visual disturbance.  Respiratory:  Negative for cough, chest tightness, shortness of breath, wheezing and stridor.   Cardiovascular:  Negative for chest pain, palpitations and leg swelling.  Gastrointestinal:  Negative for abdominal distention, abdominal pain, anal bleeding, blood in stool, constipation, diarrhea, nausea and vomiting.  Genitourinary:  Negative for difficulty urinating, dysuria, flank pain and hematuria.  Musculoskeletal:  Negative for arthralgias, back pain, gait problem, joint swelling and myalgias.  Skin:  Negative for color change, pallor, rash and wound.  Neurological:  Negative for dizziness, tremors, weakness and light-headedness.  Hematological:  Negative for adenopathy. Does not bruise/bleed easily.  Psychiatric/Behavioral:   Negative for agitation, behavioral problems, confusion, decreased concentration, dysphoric mood and sleep disturbance.       Objective:   Physical Exam Constitutional:      General: She is not in acute distress.    Appearance: Normal appearance. She is well-developed. She is not ill-appearing or diaphoretic.  HENT:     Head: Normocephalic and atraumatic.     Right Ear: Hearing and external ear normal.     Left Ear: Hearing and external ear normal.     Nose: No nasal deformity or rhinorrhea.  Eyes:     General: No scleral icterus.    Conjunctiva/sclera: Conjunctivae normal.     Right eye: Right conjunctiva is not injected.     Left eye: Left conjunctiva is not injected.     Pupils: Pupils are equal, round, and reactive to light.  Neck:     Vascular: No JVD.  Cardiovascular:     Rate and Rhythm: Normal rate and regular rhythm.     Heart sounds: S1 normal and S2 normal.  Pulmonary:     Effort: Pulmonary effort is normal. No respiratory distress.     Breath sounds: No wheezing.  Abdominal:     Palpations: Abdomen is soft.  Musculoskeletal:        General: Normal range of motion.     Right shoulder: Normal.     Left shoulder: Normal.     Cervical back: Normal range of motion and neck supple.     Right hip: Normal.     Left hip: Normal.     Right knee: Normal.     Left knee: Normal.  Lymphadenopathy:     Head:     Right side of head: No submandibular, preauricular or posterior auricular adenopathy.     Left side of head: No submandibular, preauricular or posterior auricular adenopathy.     Cervical: No cervical adenopathy.     Right cervical: No superficial or deep cervical adenopathy.    Left cervical: No superficial or deep cervical adenopathy.  Skin:    General: Skin is warm and dry.  Coloration: Skin is not pale.     Findings: No abrasion, bruising, ecchymosis, erythema, lesion or rash.     Nails: There is no clubbing.  Neurological:     General: No focal deficit  present.     Mental Status: She is alert and oriented to person, place, and time.     Sensory: No sensory deficit.     Coordination: Coordination normal.     Gait: Gait normal.  Psychiatric:        Attention and Perception: She is attentive.        Mood and Affect: Mood normal.        Speech: Speech normal.        Behavior: Behavior normal. Behavior is cooperative.        Thought Content: Thought content normal.        Judgment: Judgment normal.    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  Note multiple different dressings have been attempted and she continues to have problems with itching.  Will have the PICC line removed today I sent a prescription for cefadroxil to 500 mg tablets to be taken twice daily.  She should remain on this to get through at least a year of therapy.  Given her difficulty tolerating rifampin I have discontinued that.  Vaccine counseling recommended flu and COVID-19 vaccines which she received today in clinic.  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

## 2022-03-16 NOTE — Progress Notes (Signed)
PICC Removal    PICC length & location:  left basilic 43 cm Removed per verbal order from: Dr. Daiva Eves  Blood thinners:  aspirin 81 mg Platelet count:  228 ([facility] 03/08/22)  Site assessment: Dressing clean and dry. Extremity warm and dry with palpable radial pulse. No drainage or swelling present at insertion site. Mild erythema where skin has been subjected to adhesive. Approximately 8 cm of exposed catheter upon assessment, provider notified. Patient states this occurred yesterday during her dressing change.   Pre-removal vital signs:  BP:  146/83 HR:  85 SpO2:  97% room air  Patient unable to tolerate flat, supine position. No sutures present. Insertion site cleaned with CHG, catheter removed and petroleum dressing applied. Tip intact. Pressure held until hemostasis achieved.    Length of catheter removed:  43 cm   Provided patient with after care instructions and precautions print out (via [employer]). Reviewed this information with patient.   Patient verbalized understanding and agreement, all questions answered. Patient tolerated procedure well and remained in clinic under the care of RN 30 minutes post removal..   Post-observation vital signs:  BP:  143/83 HR:  87 SpO2:  96% room air  Notified Jeri Modena, RN with [facility] pharmacy team of removal.  Sandie Ano, RN

## 2022-04-12 ENCOUNTER — Telehealth: Payer: Self-pay

## 2022-04-12 NOTE — Telephone Encounter (Signed)
Patient states having some drainage from surgical site. Unable to describe drainage or how much due to area being open on her back and she can not see the area. States she felt some "sticky drainage" at her surgical site. Denies any fevers, some pain during the night at bed time. States drainage started yesterday. She has also contact surgeon and left a message to make tam aware.   Routing to provider for advise. Currently there are not any sooner appointments to offer.  .me

## 2022-04-13 NOTE — Telephone Encounter (Signed)
Patient heard from Dr. Dutch Quint. Sent pictures of surgical site and they agreed on continuing current antibiotic for now to see if this will cover her.

## 2022-04-14 ENCOUNTER — Encounter: Payer: Self-pay | Admitting: Infectious Disease

## 2022-04-14 DIAGNOSIS — T847XXA Infection and inflammatory reaction due to other internal orthopedic prosthetic devices, implants and grafts, initial encounter: Secondary | ICD-10-CM

## 2022-04-14 HISTORY — DX: Infection and inflammatory reaction due to other internal orthopedic prosthetic devices, implants and grafts, initial encounter: T84.7XXA

## 2022-04-14 NOTE — Progress Notes (Unsigned)
Subjective:  Chief complaint follow-up for lumbar wound infection and MSSA bacteremia  Patient ID: Brittney Hunt, female    DOB: 03/16/1961, 61 y.o.   MRN: [id number]  HPI   61 year old woman who previously had an L4-5/5-1 decompression and PSIF who presented with purulent drainage and worsening back pain.  Along with MSSA bacteremia CT showed loosening of the L4 screws, Dr. Venetia Constable took her to the operating room for washout and revision.The patient's prior incision was opened with copious purulence down to the epidural space. This was cultured, evacuated, and copiously irrigated. Infected material was debrided, sutures were removed, and the wound edges were debrided of necrotic material. The hardware was dissected bilaterally and the L4 screws were grossly loose and removed with fingertips without requiring a driver. These were then replaced with upsized screws bilaterally with improved purchase. The rod was replaced, new caps were placed x6.   She had been on cefazolin set to complete on March 24, 2022.  She has also been on rifampin given concern that the new hardware is infected.  She is having great deal of itching associated with her PICC line dressing and is wanting to know if she could have the PICC line removed.  She was also having trouble tolerating rifampin which is making her nauseous and also causing diarrhea.  Back pain is minimal at present and typically only there when she tries to bend over which she is trying to avoid.  And she is wearing a brace neurosurgery gave her.   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    Social History   Socioeconomic History   Marital status: Divorced    Spouse name: Not on file   Number of children: 2   Years of education: Not on file   Highest education level: Not on file  Occupational History   Not on file  Tobacco Use   Smoking status: Every Day    Packs/day: 0.30    Years: 40.00    Total pack years: 12.00    Types: Cigarettes   Smokeless tobacco: Never  Vaping Use   Vaping Use: Never used  Substance and Sexual Activity   Alcohol use: Never   Drug use: Yes    Types: Marijuana   Sexual activity: Not on file  Other Topics Concern   Not on file  Social History Narrative   Not on file   Social Determinants of Health   Financial Resource Strain: Not on file  Food Insecurity: Not on  file  Transportation Needs: Not on file  Physical Activity: Not on file  Stress: Not on file  Social Connections: Not on file    Allergies  Allergen Reactions   Chlorhexidine Itching    Can tolerate chg soap if rinsed off, does not tolerate wipes.     Current Outpatient Medications:    acetaminophen (TYLENOL) 325 MG tablet, Take 650 mg by mouth every 6 (six) hours as needed for moderate pain or headache., Disp: , Rfl:    albuterol (VENTOLIN HFA) 108 (90 Base) MCG/ACT inhaler, Inhale 1 puff into the lungs every 6 (six) hours as needed for shortness of breath or wheezing., Disp: , Rfl:    amLODipine (NORVASC) 10 MG tablet, Take 10 mg by mouth daily., Disp: , Rfl:    aspirin 81 MG EC tablet, Take 81 mg by mouth daily., Disp: , Rfl:     atorvastatin (LIPITOR) 20 MG tablet, Take 20 mg by mouth at bedtime., Disp: , Rfl:    buPROPion (WELLBUTRIN SR) 150 MG 12 hr tablet, Take 150 mg by mouth daily., Disp: , Rfl:    cefadroxil (DURICEF) 500 MG capsule, Take 2 capsules (1,000 mg total) by mouth 2 (two) times daily., Disp: 120 capsule, Rfl: 11   Cholecalciferol (VITAMIN D3 PO), Take 1 tablet by mouth daily., Disp: , Rfl:    cyclobenzaprine (FLEXERIL) 10 MG tablet, Take 10 mg by mouth 3 (three) times daily as needed. for muscle spams, Disp: , Rfl:    docusate sodium (COLACE) 100 MG capsule, Take 1 capsule (100 mg total) by mouth 2 (two) times daily. (Patient not taking: Reported on 03/16/2022), Disp: 10 capsule, Rfl: 0   gabapentin (NEURONTIN) 600 MG tablet, Take 600 mg by mouth 3 (three) times daily., Disp: , Rfl:    hydrOXYzine (ATARAX) 25 MG tablet, Take 25-50 mg by mouth See admin instructions. Take 25 mg by mouth in the morning and 50 mg at bedtime, Disp: , Rfl:    mometasone-formoterol (DULERA) 200-5 MCG/ACT AERO, Inhale 2 puffs into the lungs 2 (two) times daily., Disp: , Rfl:    Multiple Vitamins-Minerals (WOMENS MULTIVITAMIN) TABS, Take 1 tablet by mouth daily., Disp: , Rfl:    NON FORMULARY, Pt uses a cpap nightly, Disp: , Rfl:    Omega-3 1000 MG CAPS, Take 1,000 mg by mouth daily., Disp: , Rfl:    omeprazole (PRILOSEC) 40 MG capsule, Take 40 mg by mouth 2 (two) times daily., Disp: , Rfl:    oxyCODONE (OXY IR/ROXICODONE) 5 MG immediate release tablet, Take 1 tablet (5 mg total) by mouth every 6 (six) hours as needed for breakthrough pain ((score 4 to 6)). (Patient not taking: Reported on 03/16/2022), Disp: 20 tablet, Rfl: 0   oxyCODONE (OXYCONTIN) 15 mg 12 hr tablet, Take 1 tablet (15 mg total) by mouth every 12 (twelve) hours. (Patient not taking: Reported on 03/16/2022), Disp: 14 tablet, Rfl: 0   oxyCODONE-acetaminophen (PERCOCET) 10-325 MG tablet, Take 1 tablet by mouth every 6 (six) hours as needed., Disp: , Rfl:     phentermine (ADIPEX-P) 37.5 MG tablet, Take 37.5 mg by mouth daily before breakfast. (Patient not taking: Reported on 02/08/2022), Disp: , Rfl:    primidone (MYSOLINE) 50 MG tablet, Take 100 mg by mouth at bedtime., Disp: , Rfl:    QUEtiapine (SEROQUEL) 200 MG tablet, Take 200 mg by mouth at bedtime., Disp: , Rfl:    tiZANidine (ZANAFLEX) 4 MG tablet, Take 1 tablet (4 mg total) by mouth 3 (three) times  daily. (Patient not taking: Reported on 03/16/2022), Disp: 30 tablet, Rfl: 0   traZODone (DESYREL) 150 MG tablet, Take 300 mg by mouth at bedtime., Disp: , Rfl:    triamcinolone cream (KENALOG) 0.1 %, Apply 1 application  topically daily as needed (itching)., Disp: , Rfl:    Review of Systems     Objective:   Physical Exam PICC         Assessment & Plan:   MSSA bacteremia due to infected hardware in lumbar spine sp Neurosurgery:  I will check ESR, CRP, BMP w GFR and CBC w diff  I will continue her cefadroxil rx

## 2022-04-15 ENCOUNTER — Ambulatory Visit (INDEPENDENT_AMBULATORY_CARE_PROVIDER_SITE_OTHER): Payer: Medicare Other | Admitting: Infectious Disease

## 2022-04-15 ENCOUNTER — Other Ambulatory Visit: Payer: Self-pay

## 2022-04-15 VITALS — BP 143/91 | HR 84 | Temp 97.8°F | Wt 230.0 lb

## 2022-04-15 DIAGNOSIS — M4626 Osteomyelitis of vertebra, lumbar region: Secondary | ICD-10-CM | POA: Diagnosis not present

## 2022-04-15 DIAGNOSIS — R7881 Bacteremia: Secondary | ICD-10-CM

## 2022-04-15 DIAGNOSIS — T8149XA Infection following a procedure, other surgical site, initial encounter: Secondary | ICD-10-CM

## 2022-04-15 DIAGNOSIS — T847XXD Infection and inflammatory reaction due to other internal orthopedic prosthetic devices, implants and grafts, subsequent encounter: Secondary | ICD-10-CM | POA: Diagnosis not present

## 2022-04-15 DIAGNOSIS — B9561 Methicillin susceptible Staphylococcus aureus infection as the cause of diseases classified elsewhere: Secondary | ICD-10-CM

## 2022-04-15 MED ORDER — CEFADROXIL 500 MG PO CAPS: 1000.0000 mg | ORAL_CAPSULE | Freq: Two times a day (BID) | ORAL | 11 refills | Status: DC

## 2022-04-16 LAB — CBC WITH DIFFERENTIAL/PLATELET
Absolute Monocytes: 352 cells/uL (ref 200–950)
Basophils Absolute: 31 cells/uL (ref 0–200)
Basophils Relative: 0.6 %
Eosinophils Absolute: 133 cells/uL (ref 15–500)
Eosinophils Relative: 2.6 %
HCT: 41.2 % (ref 35.0–45.0)
Hemoglobin: 13.4 g/dL (ref 11.7–15.5)
Lymphs Abs: 1805 cells/uL (ref 850–3900)
MCH: 28.2 pg (ref 27.0–33.0)
MCHC: 32.5 g/dL (ref 32.0–36.0)
MCV: 86.7 fL (ref 80.0–100.0)
MPV: 11.3 fL (ref 7.5–12.5)
Monocytes Relative: 6.9 %
Neutro Abs: 2780 cells/uL (ref 1500–7800)
Neutrophils Relative %: 54.5 %
Platelets: 207 10*3/uL (ref 140–400)
RBC: 4.75 10*6/uL (ref 3.80–5.10)
RDW: 15 % (ref 11.0–15.0)
Total Lymphocyte: 35.4 %
WBC: 5.1 10*3/uL (ref 3.8–10.8)

## 2022-04-16 LAB — C-REACTIVE PROTEIN: CRP: 3.7 mg/L (ref <8.0)

## 2022-04-16 LAB — BASIC METABOLIC PANEL WITH GFR
BUN: 11 mg/dL (ref 7–25)
CO2: 30 mmol/L (ref 20–32)
Calcium: 9.9 mg/dL (ref 8.6–10.4)
Chloride: 100 mmol/L (ref 98–110)
Creat: 0.74 mg/dL (ref 0.50–1.05)
Glucose, Bld: 107 mg/dL — ABNORMAL HIGH (ref 65–99)
Potassium: 4.1 mmol/L (ref 3.5–5.3)
Sodium: 136 mmol/L (ref 135–146)
eGFR: 91 mL/min/{1.73_m2} (ref >=60)

## 2022-04-16 LAB — SEDIMENTATION RATE: Sed Rate: 41 mm/h — ABNORMAL HIGH (ref 0–30)

## 2022-05-19 ENCOUNTER — Encounter: Payer: Self-pay | Admitting: Internal Medicine

## 2022-05-19 ENCOUNTER — Ambulatory Visit (INDEPENDENT_AMBULATORY_CARE_PROVIDER_SITE_OTHER): Payer: 59 | Admitting: Internal Medicine

## 2022-05-19 ENCOUNTER — Other Ambulatory Visit: Payer: Self-pay

## 2022-05-19 VITALS — BP 121/80 | HR 78 | Temp 98.5°F | Wt 236.2 lb

## 2022-05-19 DIAGNOSIS — T8149XA Infection following a procedure, other surgical site, initial encounter: Secondary | ICD-10-CM

## 2022-05-19 NOTE — Progress Notes (Signed)
   Subjective:    Patient ID: Lincoln Brigham, female    DOB: 20-Dec-1960, 62 y.o.   MRN: [id number]  HPI Here for follow up of discitis She has a history of L4-5 to S decompression and developed a post operative wound infection with purulent drainage with MSSA bacteremia.  She underwent washout and revision by Dr. Zada Finders and completed nearly 6 weeks of antibiotics.  She was transitioned to oral treatment and continued to have significant pain and did not completely close.  Her inflammatory markers were reassuring.   She feels much better now, no significant pain at this point.  No concerns.    Review of Systems  Constitutional:  Negative for chills, fatigue and fever.  Gastrointestinal:  Negative for diarrhea.  Skin:  Negative for rash.       Objective:   Physical Exam Eyes:     General: No scleral icterus. Pulmonary:     Effort: Pulmonary effort is normal.  Skin:    Comments: Incision closed with no tenderness, no warmth, no erythema  Neurological:     Mental Status: She is alert.           Assessment & Plan:

## 2022-05-19 NOTE — Assessment & Plan Note (Signed)
She is much better clinically now so reassuring and incision closed.  No current concerns and will verify with repeat ESR and CRP.   She will continue with cefadroxil and follow up in about 2 months.   I have personally spent 30 minutes involved in face-to-face and non-face-to-face activities for this patient on the day of the visit. Professional time spent includes the following activities: Preparing to see the patient (review of tests), Obtaining and/or reviewing separately obtained history (admission/discharge record), Performing a medically appropriate examination and/or evaluation , Ordering medications/tests/procedures, referring and communicating with other health care professionals, Documenting clinical information in the EMR, Independently interpreting results (not separately reported), Communicating results to the patient/family/caregiver, Counseling and educating the patient/family/caregiver and Care coordination (not separately reported).

## 2022-05-20 LAB — BASIC METABOLIC PANEL
BUN: 14 mg/dL (ref 7–25)
CO2: 29 mmol/L (ref 20–32)
Calcium: 9.2 mg/dL (ref 8.6–10.4)
Chloride: 102 mmol/L (ref 98–110)
Creat: 0.75 mg/dL (ref 0.50–1.05)
Glucose, Bld: 98 mg/dL (ref 65–99)
Potassium: 4 mmol/L (ref 3.5–5.3)
Sodium: 137 mmol/L (ref 135–146)

## 2022-05-20 LAB — C-REACTIVE PROTEIN: CRP: 7.5 mg/L (ref <8.0)

## 2022-05-20 LAB — SEDIMENTATION RATE: Sed Rate: 33 mm/h — ABNORMAL HIGH (ref 0–30)

## 2022-07-14 ENCOUNTER — Other Ambulatory Visit (HOSPITAL_COMMUNITY): Payer: Self-pay | Admitting: Orthopedic Surgery

## 2022-07-14 DIAGNOSIS — M25561 Pain in right knee: Secondary | ICD-10-CM

## 2022-07-22 ENCOUNTER — Encounter (HOSPITAL_COMMUNITY)
Admission: RE | Admit: 2022-07-22 | Discharge: 2022-07-22 | Disposition: A | Discharge status: Home / Self Care | Payer: 59 | Source: Ambulatory Visit | Attending: Orthopedic Surgery | Admitting: Orthopedic Surgery

## 2022-07-22 DIAGNOSIS — M25561 Pain in right knee: Secondary | ICD-10-CM

## 2022-07-22 MED ORDER — TECHNETIUM TC 99M MEDRONATE IV KIT
20.0000 | PACK | Freq: Once | INTRAVENOUS | Status: AC | PRN
  Administered 2022-07-22: 20.7 via INTRAVENOUS

## 2022-07-28 ENCOUNTER — Other Ambulatory Visit: Payer: Self-pay

## 2022-07-28 ENCOUNTER — Encounter: Payer: Self-pay | Admitting: Infectious Disease

## 2022-07-28 ENCOUNTER — Ambulatory Visit (INDEPENDENT_AMBULATORY_CARE_PROVIDER_SITE_OTHER): Payer: 59 | Admitting: Infectious Disease

## 2022-07-28 VITALS — BP 113/77 | HR 76 | Temp 97.7°F | Ht 67.0 in | Wt 233.0 lb

## 2022-07-28 DIAGNOSIS — T847XXD Infection and inflammatory reaction due to other internal orthopedic prosthetic devices, implants and grafts, subsequent encounter: Secondary | ICD-10-CM

## 2022-07-28 DIAGNOSIS — R7881 Bacteremia: Secondary | ICD-10-CM | POA: Diagnosis not present

## 2022-07-28 DIAGNOSIS — M4626 Osteomyelitis of vertebra, lumbar region: Secondary | ICD-10-CM | POA: Diagnosis not present

## 2022-07-28 DIAGNOSIS — T8149XA Infection following a procedure, other surgical site, initial encounter: Secondary | ICD-10-CM

## 2022-07-28 DIAGNOSIS — B9561 Methicillin susceptible Staphylococcus aureus infection as the cause of diseases classified elsewhere: Secondary | ICD-10-CM

## 2022-07-28 DIAGNOSIS — M4310 Spondylolisthesis, site unspecified: Secondary | ICD-10-CM

## 2022-07-28 NOTE — Progress Notes (Signed)
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   MRN: [id number]  HPI   62 year old woman who previously had an L4-5/5-1 decompression and PSIF who presented with purulent drainage and worsening back pain.  Along with MSSA bacteremia CT showed loosening of the L4 screws, Dr. Venetia Constable took her to the operating room for washout and revision.The patient's prior incision was opened with copious purulence down to the epidural space. This was cultured, evacuated, and copiously irrigated. Infected material was debrided, sutures were removed, and the wound edges were debrided of necrotic material. The hardware was dissected bilaterally and the L4 screws were grossly loose and removed with fingertips without requiring a driver. These were then replaced with upsized screws bilaterally with improved purchase. The rod was replaced, new caps were placed x6.   She had been on cefazolin  completeD on March 24, 2022.  She had also been on rifampin given concern that the new hardware is infected.  Shewas having great deal of itching associated with her PICC line dressing   She was also having trouble tolerating rifampin which was making her nauseous and also causing diarrhea.  Back pain was  minimal the second to last visit I saw her.  We stopped her rifampin and switch her to oral cefadroxil.  4 days prior to her last visit she  had increased inferior aspect of her wound.  She called neurosurgery and I believe Dr. Trenton Gammon prescribed doxycycline which she started to take along with the cefadroxil.  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  Have concerned that she might need further debridement given the worsening drainage and that she might need imaging certainly.  I had her follow-up my partner Dr.  Linus Salmons.  At that point in time the drainage had resolved and her pain was much improved.  Her inflammatory markers were also reassuring.  She tells me that she has been feeling better and better since that visit as well.     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    Social History   Socioeconomic History   Marital status: Divorced    Spouse name: Not on file   Number of children: 2   Years of education: Not on file   Highest education level: Not on file  Occupational History   Not on  file  Tobacco Use   Smoking status: Every Day    Packs/day: 0.30    Years: 40.00    Additional pack years: 0.00    Total pack years: 12.00    Types: Cigarettes   Smokeless tobacco: Never  Vaping Use   Vaping Use: Never used  Substance and Sexual Activity   Alcohol use: Never   Drug use: Yes    Types: Marijuana   Sexual activity: Not on file  Other Topics Concern   Not on file  Social History Narrative   Not on file   Social Determinants of Health   Financial Resource Strain: Not on file  Food Insecurity: Not on file  Transportation Needs: Not on file  Physical Activity: Not on file  Stress: Not on  file  Social Connections: Not on file    Allergies  Allergen Reactions   Chlorhexidine Itching    Can tolerate chg soap if rinsed off, does not tolerate wipes.     Current Outpatient Medications:    acetaminophen (TYLENOL) 325 MG tablet, Take 650 mg by mouth every 6 (six) hours as needed for moderate pain or headache., Disp: , Rfl:    albuterol (VENTOLIN HFA) 108 (90 Base) MCG/ACT inhaler, Inhale 1 puff into the lungs every 6 (six) hours as needed for shortness of breath or wheezing., Disp: , Rfl:    amLODipine (NORVASC) 10 MG tablet, Take 10 mg by mouth daily., Disp: , Rfl:    aspirin 81 MG EC tablet, Take 81 mg by mouth daily., Disp: , Rfl:    atorvastatin (LIPITOR) 20 MG tablet, Take 20 mg by mouth at bedtime., Disp: , Rfl:    buPROPion (WELLBUTRIN SR) 150 MG 12 hr tablet, Take 150 mg by mouth daily., Disp: , Rfl:    cefadroxil (DURICEF) 500 MG capsule, Take 2 capsules (1,000 mg total) by mouth 2 (two) times daily., Disp: 120 capsule, Rfl: 11   Cholecalciferol (VITAMIN D3 PO), Take 1 tablet by mouth daily., Disp: , Rfl:    cyclobenzaprine (FLEXERIL) 10 MG tablet, Take 10 mg by mouth 3 (three) times daily as needed. for muscle spams, Disp: , Rfl:    gabapentin (NEURONTIN) 600 MG tablet, Take 600 mg by mouth 3 (three) times daily., Disp: , Rfl:    hydrOXYzine (ATARAX) 25 MG tablet, Take 25-50 mg by mouth See admin instructions. Take 25 mg by mouth in the morning and 50 mg at bedtime, Disp: , Rfl:    mometasone-formoterol (DULERA) 200-5 MCG/ACT AERO, Inhale 2 puffs into the lungs 2 (two) times daily., Disp: , Rfl:    Multiple Vitamins-Minerals (WOMENS MULTIVITAMIN) TABS, Take 1 tablet by mouth daily., Disp: , Rfl:    NON FORMULARY, Pt uses a cpap nightly, Disp: , Rfl:    Omega-3 1000 MG CAPS, Take 1,000 mg by mouth daily., Disp: , Rfl:    omeprazole (PRILOSEC) 40 MG capsule, Take 40 mg by mouth 2 (two) times daily., Disp: , Rfl:    oxyCODONE-acetaminophen (PERCOCET) 10-325 MG tablet, Take  1 tablet by mouth every 6 (six) hours as needed., Disp: , Rfl:    primidone (MYSOLINE) 50 MG tablet, Take 100 mg by mouth at bedtime., Disp: , Rfl:    QUEtiapine (SEROQUEL) 200 MG tablet, Take 200 mg by mouth at bedtime., Disp: , Rfl:    traZODone (DESYREL) 150 MG tablet, Take 300 mg by mouth at bedtime., Disp: , Rfl:    triamcinolone cream (KENALOG) 0.1 %, Apply 1 application  topically daily  as needed (itching)., Disp: , Rfl:    Review of Systems  Constitutional:  Negative for activity change, appetite change, chills, diaphoresis, fatigue, fever and unexpected weight change.  HENT:  Negative for congestion, rhinorrhea, sinus pressure, sneezing, sore throat and trouble swallowing.   Eyes:  Negative for photophobia and visual disturbance.  Respiratory:  Negative for cough, chest tightness, shortness of breath, wheezing and stridor.   Cardiovascular:  Negative for chest pain, palpitations and leg swelling.  Gastrointestinal:  Negative for abdominal distention, abdominal pain, anal bleeding, blood in stool, constipation, diarrhea, nausea and vomiting.  Genitourinary:  Negative for difficulty urinating, dysuria, flank pain and hematuria.  Musculoskeletal:  Positive for back pain. Negative for arthralgias, gait problem, joint swelling and myalgias.  Skin:  Negative for color change, pallor, rash and wound.  Neurological:  Negative for dizziness, tremors, weakness and light-headedness.  Hematological:  Negative for adenopathy. Does not bruise/bleed easily.  Psychiatric/Behavioral:  Negative for agitation, behavioral problems, confusion, decreased concentration, dysphoric mood and sleep disturbance.        Objective:   Physical Exam Constitutional:      General: She is not in acute distress.    Appearance: Normal appearance. She is well-developed. She is not ill-appearing or diaphoretic.  HENT:     Head: Normocephalic and atraumatic.     Right Ear: Hearing and external ear normal.     Left  Ear: Hearing and external ear normal.     Nose: No nasal deformity or rhinorrhea.  Eyes:     General: No scleral icterus.    Conjunctiva/sclera: Conjunctivae normal.     Right eye: Right conjunctiva is not injected.     Left eye: Left conjunctiva is not injected.     Pupils: Pupils are equal, round, and reactive to light.  Neck:     Vascular: No JVD.  Cardiovascular:     Rate and Rhythm: Normal rate and regular rhythm.     Heart sounds: S1 normal and S2 normal.  Pulmonary:     Effort: Pulmonary effort is normal. No respiratory distress.     Breath sounds: No wheezing.  Abdominal:     General: Bowel sounds are normal. There is no distension.     Palpations: Abdomen is soft.     Tenderness: There is no abdominal tenderness.  Musculoskeletal:        General: Normal range of motion.     Right shoulder: Normal.     Left shoulder: Normal.     Cervical back: Normal range of motion and neck supple.     Right hip: Normal.     Left hip: Normal.     Right knee: Normal.     Left knee: Normal.  Lymphadenopathy:     Head:     Right side of head: No submandibular, preauricular or posterior auricular adenopathy.     Left side of head: No submandibular, preauricular or posterior auricular adenopathy.     Cervical: No cervical adenopathy.     Right cervical: No superficial or deep cervical adenopathy.    Left cervical: No superficial or deep cervical adenopathy.  Skin:    General: Skin is warm and dry.     Coloration: Skin is not pale.     Findings: No abrasion, bruising, ecchymosis, erythema, lesion or rash.     Nails: There is no clubbing.  Neurological:     General: No focal deficit present.     Mental Status: She is alert and oriented to person,  place, and time.     Sensory: No sensory deficit.     Coordination: Coordination normal.     Gait: Gait normal.  Psychiatric:        Attention and Perception: She is attentive.        Mood and Affect: Mood normal.        Speech: Speech  normal.        Behavior: Behavior normal. Behavior is cooperative.        Thought Content: Thought content normal.        Judgment: Judgment normal.         Assessment & Plan:   Mrs. Had bacteremia due to infected hardware and lumbar spine post neurosurgery:  Recheck sed rate CRP CBC with differential and BMP.  She will continue on cefadroxil.  I spent 28 minutes with the patient including t in face to face counseling of the patient and the above problems personally along with review of medical records in preparation for the visit and during the visit and in coordination of her care.

## 2022-07-29 LAB — CBC WITH DIFFERENTIAL/PLATELET
Absolute Monocytes: 308 cells/uL (ref 200–950)
Basophils Absolute: 28 cells/uL (ref 0–200)
Basophils Relative: 0.5 %
Eosinophils Absolute: 138 cells/uL (ref 15–500)
Eosinophils Relative: 2.5 %
HCT: 37 % (ref 35.0–45.0)
Hemoglobin: 12.3 g/dL (ref 11.7–15.5)
Lymphs Abs: 2673 cells/uL (ref 850–3900)
MCH: 29 pg (ref 27.0–33.0)
MCHC: 33.2 g/dL (ref 32.0–36.0)
MCV: 87.3 fL (ref 80.0–100.0)
MPV: 10.7 fL (ref 7.5–12.5)
Monocytes Relative: 5.6 %
Neutro Abs: 2354 cells/uL (ref 1500–7800)
Neutrophils Relative %: 42.8 %
Platelets: 206 10*3/uL (ref 140–400)
RBC: 4.24 10*6/uL (ref 3.80–5.10)
RDW: 13.2 % (ref 11.0–15.0)
Total Lymphocyte: 48.6 %
WBC: 5.5 10*3/uL (ref 3.8–10.8)

## 2022-07-29 LAB — BASIC METABOLIC PANEL WITH GFR
BUN: 13 mg/dL (ref 7–25)
CO2: 28 mmol/L (ref 20–32)
Calcium: 9.6 mg/dL (ref 8.6–10.4)
Chloride: 103 mmol/L (ref 98–110)
Creat: 0.79 mg/dL (ref 0.50–1.05)
Glucose, Bld: 80 mg/dL (ref 65–99)
Potassium: 4.7 mmol/L (ref 3.5–5.3)
Sodium: 138 mmol/L (ref 135–146)
eGFR: 85 mL/min/{1.73_m2} (ref >=60)

## 2022-07-29 LAB — SEDIMENTATION RATE: Sed Rate: 29 mm/h (ref 0–30)

## 2022-07-29 LAB — C-REACTIVE PROTEIN: CRP: 3.1 mg/L (ref <8.0)

## 2023-01-07 IMAGING — CT CT CHEST LUNG CANCER SCREENING LOW DOSE W/O CM
1 series · 10 of 10 positions shown, 13 images · non-contrast
Comparison: None Available.

CLINICAL DATA: Ninety-three pack-year smoking history/current
smoker



[ct lung segmentation data · axial · 0.83mm/px · z∈[-412,-412]mm · 10 of 340 frames shown]
[frame 1/340  mediastinal]
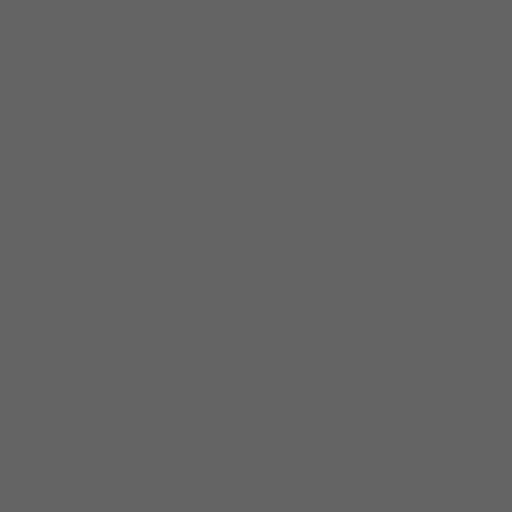
[frame 1/340  lung]
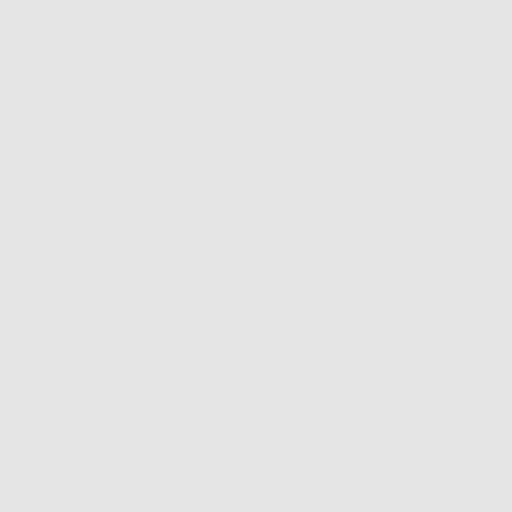
[frame 38/340  lung]
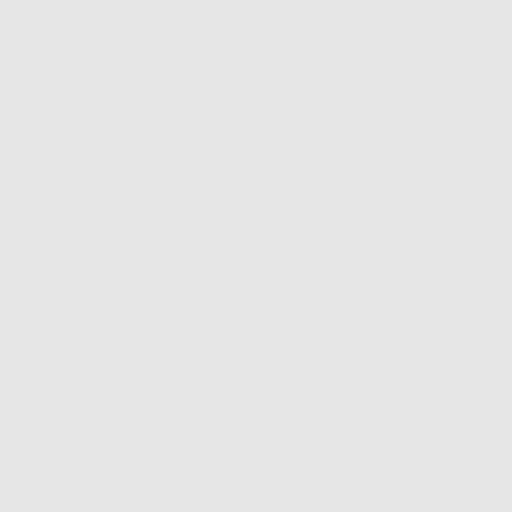
[frame 76/340  lung]
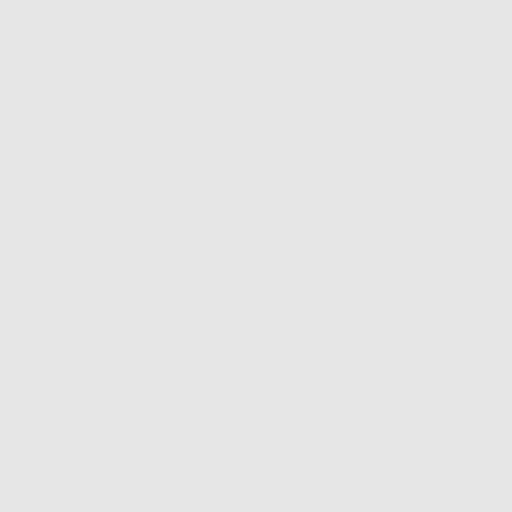
[frame 114/340  lung]
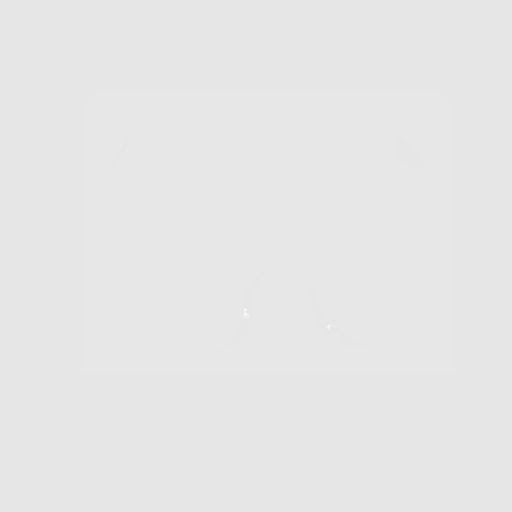
[frame 151/340  mediastinal]
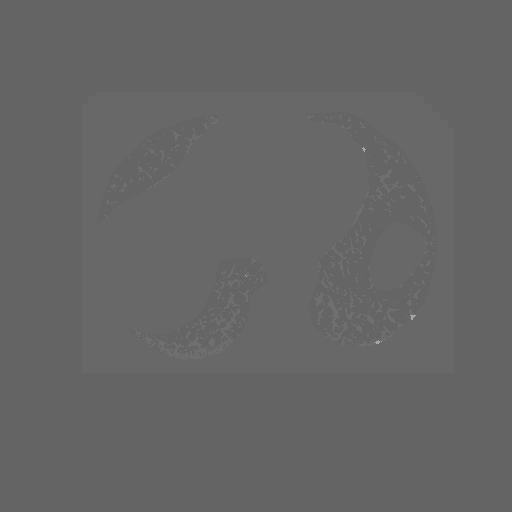
[frame 151/340  lung]
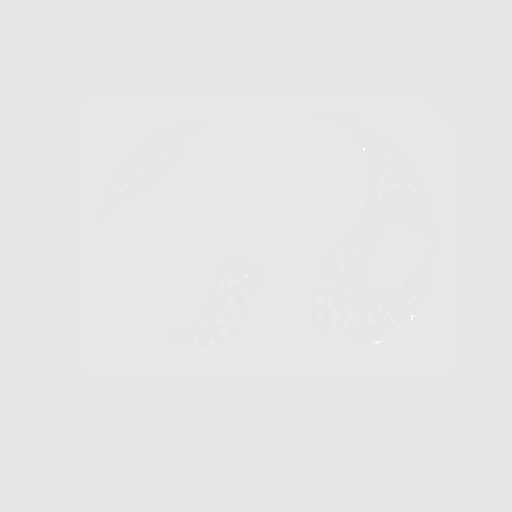
[frame 189/340  lung]
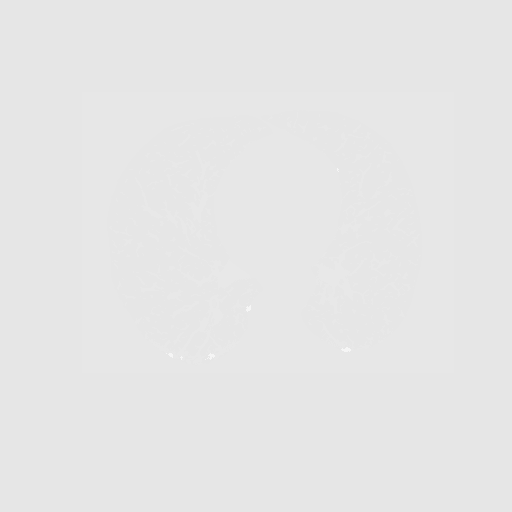
[frame 227/340  lung]
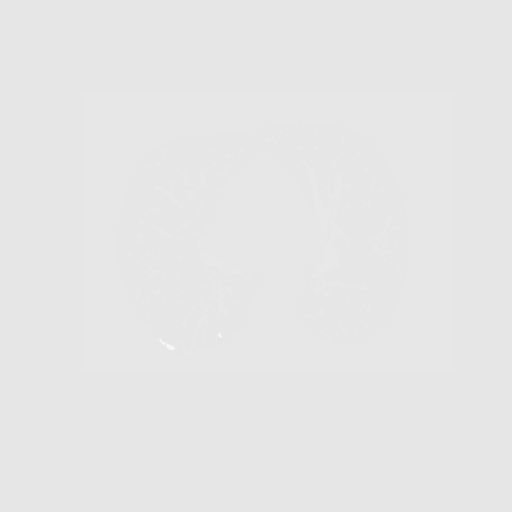
[frame 264/340  lung]
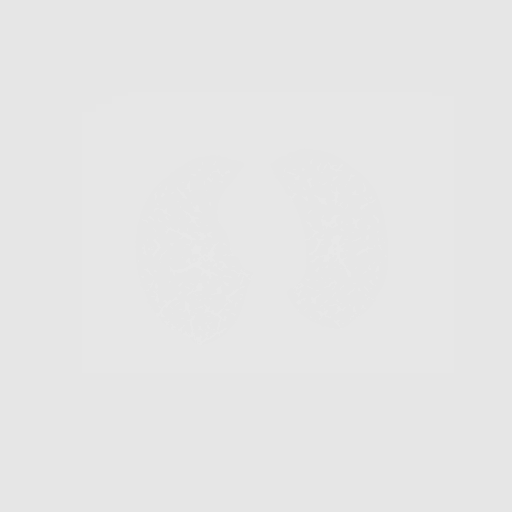
[frame 302/340  mediastinal]
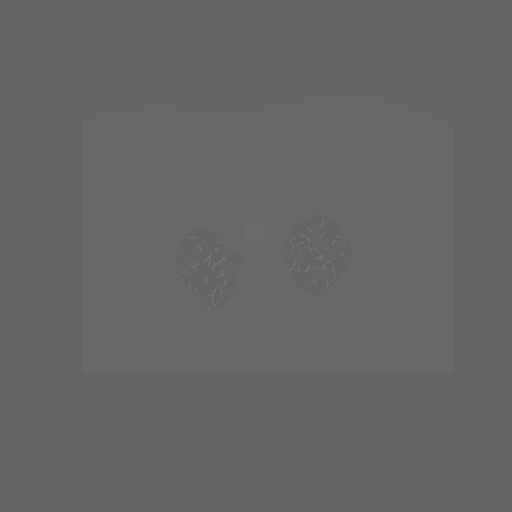
[frame 302/340  lung]
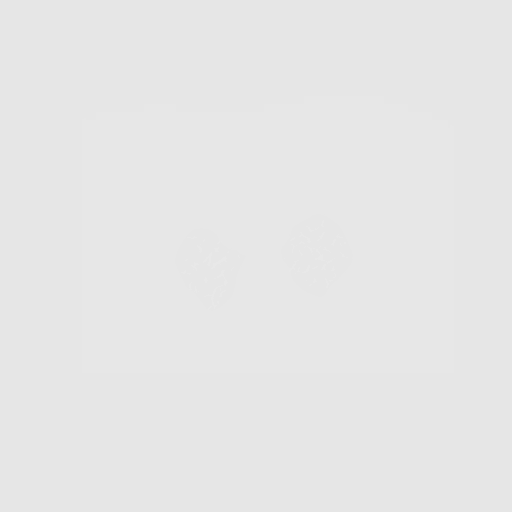
[frame 340/340  lung]
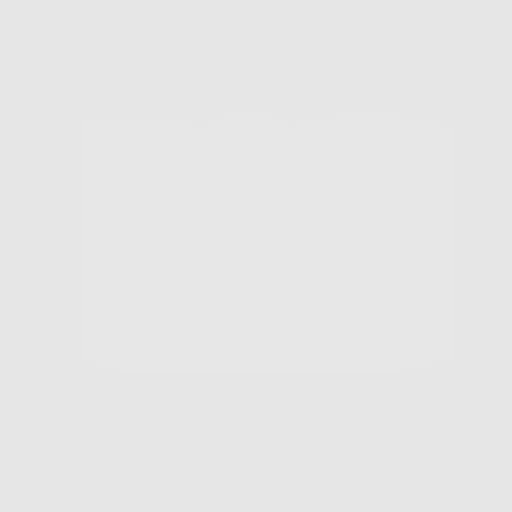

[10 of 10 positions shown; findings below may reference images not displayed]

FINDINGS: Cardiovascular: Bovine arch. Aortic atherosclerosis. Normal heart
size, without pericardial effusion. Lad coronary artery
calcification.

Mediastinum/Nodes: No mediastinal or definite hilar adenopathy,
given limitations of unenhanced CT.

Lungs/Pleura: No pleural fluid. Mild centrilobular emphysema. No
suspicious pulmonary nodule or mass.

Upper Abdomen: Mild hepatic steatosis. Normal imaged portions of the
spleen, stomach, pancreas, gallbladder, adrenal glands, right
kidney. Suspect mild left caliectasis and proximal hydroureter,
incompletely imaged including on 68/2.

Musculoskeletal: No acute osseous abnormality.
IMPRESSION: 1. Lung-RADS 1, negative. Continue annual screening with low-dose
chest CT without contrast in 12 months.
2. Hepatic steatosis.
3. Suspect mild left caliectasis and proximal hydroureter,
incompletely imaged. Consider dedicated renal ultrasound.
4. Aortic Atherosclerosis (T6B4E-BJH.H) and Emphysema ([id number].T).
5. Age advanced coronary artery atherosclerosis. Recommend
assessment of coronary risk factors.

## 2023-01-26 ENCOUNTER — Ambulatory Visit: Payer: 59 | Admitting: Infectious Disease

## 2023-03-01 ENCOUNTER — Ambulatory Visit: Payer: 59 | Admitting: Infectious Disease

## 2023-03-08 ENCOUNTER — Encounter (HOSPITAL_BASED_OUTPATIENT_CLINIC_OR_DEPARTMENT_OTHER): Admission: RE | Payer: Self-pay | Source: Home / Self Care

## 2023-03-08 ENCOUNTER — Ambulatory Visit (HOSPITAL_BASED_OUTPATIENT_CLINIC_OR_DEPARTMENT_OTHER): Admission: RE | Admit: 2023-03-08 | Payer: 59 | Source: Home / Self Care | Admitting: Orthopedic Surgery

## 2023-03-08 SURGERY — EXCISION MASS
Anesthesia: Choice | Laterality: Right

## 2023-04-21 ENCOUNTER — Other Ambulatory Visit: Payer: Self-pay | Admitting: Infectious Disease

## 2023-04-22 ENCOUNTER — Telehealth: Payer: Self-pay

## 2023-04-22 NOTE — Telephone Encounter (Signed)
Received voicemail for medication refill request. Patient made aware that request was fulfilled on 04/21/23. Patient appreciative of phone call.  Valarie Cones, LPN

## 2023-05-08 ENCOUNTER — Encounter: Payer: Self-pay | Admitting: Infectious Disease

## 2023-05-08 DIAGNOSIS — Z7185 Encounter for immunization safety counseling: Secondary | ICD-10-CM

## 2023-05-08 HISTORY — DX: Encounter for immunization safety counseling: Z71.85

## 2023-05-08 NOTE — Progress Notes (Deleted)
 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   MRN: [id number]  HPI   63 year old woman who previously had an L4-5/5-1 decompression and PSIF who presented with purulent drainage and worsening back pain.  Along with MSSA bacteremia CT showed loosening of the L4 screws, Dr. Rockney took her to the operating room for washout and revision.The patient's prior incision was opened with copious purulence down to the epidural space. This was cultured, evacuated, and copiously irrigated. Infected material was debrided, sutures were removed, and the wound edges were debrided of necrotic material. The hardware was dissected bilaterally and the L4 screws were grossly loose and removed with fingertips without requiring a driver. These were then replaced with upsized screws bilaterally with improved purchase. The rod was replaced, new caps were placed x6.   She had been on cefazolin   completeD on March 24, 2022.  She had also been on rifampin  given concern that the new hardware is infected.  Shewas having great deal of itching associated with her PICC line dressing   She was also having trouble tolerating rifampin  which was making her nauseous and also causing diarrhea.  Back pain was  minimal the second to last visit I saw her.  We stopped her rifampin  and switch her to oral cefadroxil .  4 days prior to her last visit she  had increased inferior aspect of her wound.  She called neurosurgery and I believe Dr. Malcolm prescribed doxycycline which she started to take along with the cefadroxil .  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  Have concerned that she might need further debridement given the worsening drainage and that she might need imaging certainly.  I had her follow-up my partner Dr.  Efrain.  At that point in time the drainage had resolved and her pain was much improved.  Her inflammatory markers were also reassuring.  She tells me that she has been feeling better and better since that visit as well.     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    Social History   Socioeconomic History   Marital status: Divorced    Spouse name: Not on file   Number of children: 2   Years of education: Not on file   Highest education level: Not on file  Occupational  History   Not on file  Tobacco Use   Smoking status: Every Day    Current packs/day: 0.30    Average packs/day: 0.3 packs/day for 40.0 years (12.0 ttl pk-yrs)    Types: Cigarettes   Smokeless tobacco: Never  Vaping Use   Vaping status: Never Used  Substance and Sexual Activity   Alcohol use: Never   Drug use: Yes    Types: Marijuana   Sexual activity: Not on file  Other Topics Concern   Not on file  Social History Narrative   Not on file   Social Drivers of Health   Financial Resource Strain: Patient Declined (07/07/2022)   Received from [facility], [facility]   Overall Financial  Resource Strain (CARDIA)    Difficulty of Paying Living Expenses: Patient declined  Food Insecurity: Patient Declined (07/07/2022)   Received from [facility], [facility]   Hunger Vital Sign    Worried About Running Out of Food in the Last Year: Patient declined    Ran Out of Food in the Last Year: Patient declined  Transportation Needs: Patient Declined (07/07/2022)   Received from [facility], [facility]   [facility] - Transportation    Lack of Transportation (Medical): Patient declined    Lack of Transportation (Non-Medical): Patient declined  Physical Activity: Inactive (11/15/2021)   [facility], [facility]   Exercise Vital Sign    Days of Exercise per Week: 0 days    Minutes of Exercise per Session: 0 min  Stress: Stress Concern Present (11/15/2021)   Received from [facility], [facility], [facility]   [employer] [facility] [employer] - Occupational Stress Questionnaire    Feeling of Stress : Very much  Social Connections: Unknown (12/06/2022)   Received from [facility]   Social Network    Social Network: Not on file    Allergies  Allergen Reactions   Chlorhexidine  Itching    Can tolerate chg soap if rinsed off, does not tolerate wipes.     Current Outpatient Medications:    acetaminophen  (TYLENOL ) 325 MG tablet, Take 650 mg by mouth every 6 (six) hours as needed for moderate pain or headache., Disp: , Rfl:    albuterol  (VENTOLIN  HFA) 108 (90 Base) MCG/ACT inhaler, Inhale 1 puff into the lungs every 6 (six) hours as needed for shortness of breath or wheezing., Disp: , Rfl:    amLODipine  (NORVASC ) 10 MG tablet, Take 10 mg by mouth daily., Disp: , Rfl:    aspirin  81 MG EC tablet, Take 81 mg by mouth daily., Disp: , Rfl:    atorvastatin  (LIPITOR) 20 MG tablet, Take 20 mg by mouth at bedtime., Disp: , Rfl:    buPROPion  (WELLBUTRIN  SR) 150 MG 12 hr tablet, Take 150 mg by mouth daily., Disp: , Rfl:    cefadroxil  (DURICEF)  500 MG capsule, Take 2 capsules by mouth twice daily, Disp: 120 capsule, Rfl: 0   Cholecalciferol  (VITAMIN D3 PO), Take 1 tablet by mouth daily., Disp: , Rfl:    cyclobenzaprine  (FLEXERIL ) 10 MG tablet, Take 10 mg by mouth 3 (three) times daily as needed. for muscle spams, Disp: , Rfl:    gabapentin (NEURONTIN) 600 MG tablet, Take 600 mg by mouth 3 (three) times daily. (Patient not taking: Reported on 07/28/2022), Disp: , Rfl:    hydrOXYzine  (ATARAX ) 25 MG tablet, Take 25-50 mg by mouth See admin instructions. Take 25 mg by mouth in the morning and 50 mg at bedtime,  Disp: , Rfl:    mometasone -formoterol  (DULERA ) 200-5 MCG/ACT AERO, Inhale 2 puffs into the lungs 2 (two) times daily., Disp: , Rfl:    Multiple Vitamins-Minerals (WOMENS MULTIVITAMIN) TABS, Take 1 tablet by mouth daily., Disp: , Rfl:    NON FORMULARY, Pt uses a cpap nightly, Disp: , Rfl:    Omega-3 1000 MG CAPS, Take 1,000 mg by mouth daily., Disp: , Rfl:    omeprazole (PRILOSEC) 40 MG capsule, Take 40 mg by mouth 2 (two) times daily., Disp: , Rfl:    oxyCODONE -acetaminophen  (PERCOCET) 10-325 MG tablet, Take 1 tablet by mouth every 6 (six) hours as needed., Disp: , Rfl:    primidone  (MYSOLINE ) 50 MG tablet, Take 100 mg by mouth at bedtime., Disp: , Rfl:    QUEtiapine  (SEROQUEL ) 200 MG tablet, Take 200 mg by mouth at bedtime., Disp: , Rfl:    traZODone  (DESYREL ) 150 MG tablet, Take 300 mg by mouth at bedtime., Disp: , Rfl:    triamcinolone  cream (KENALOG ) 0.1 %, Apply 1 application  topically daily as needed (itching)., Disp: , Rfl:    Review of Systems     Objective:   Physical Exam      Assessment & Plan:   Mrs. Had bacteremia due to infected hardware and lumbar spine post neurosurgery:  Recheck sed rate CRP CBC with differential and BMP.  She will continue on cefadroxil .  I spent 28 minutes with the patient including t in face to face counseling of the patient and the above problems personally along with review of medical  records in preparation for the visit and during the visit and in coordination of her care.

## 2023-05-09 ENCOUNTER — Ambulatory Visit: Payer: 59 | Admitting: Infectious Disease

## 2023-05-16 ENCOUNTER — Encounter: Payer: Self-pay | Admitting: Infectious Disease

## 2023-05-16 ENCOUNTER — Other Ambulatory Visit: Payer: Self-pay

## 2023-05-16 ENCOUNTER — Ambulatory Visit (INDEPENDENT_AMBULATORY_CARE_PROVIDER_SITE_OTHER): Payer: 59 | Admitting: Infectious Disease

## 2023-05-16 VITALS — BP 127/80 | HR 77 | Resp 16 | Ht 67.0 in | Wt 247.0 lb

## 2023-05-16 DIAGNOSIS — F17210 Nicotine dependence, cigarettes, uncomplicated: Secondary | ICD-10-CM | POA: Diagnosis not present

## 2023-05-16 DIAGNOSIS — T847XXD Infection and inflammatory reaction due to other internal orthopedic prosthetic devices, implants and grafts, subsequent encounter: Secondary | ICD-10-CM | POA: Diagnosis not present

## 2023-05-16 DIAGNOSIS — B9561 Methicillin susceptible Staphylococcus aureus infection as the cause of diseases classified elsewhere: Secondary | ICD-10-CM | POA: Diagnosis not present

## 2023-05-16 DIAGNOSIS — R7881 Bacteremia: Secondary | ICD-10-CM

## 2023-05-16 DIAGNOSIS — F17200 Nicotine dependence, unspecified, uncomplicated: Secondary | ICD-10-CM

## 2023-05-16 DIAGNOSIS — D1723 Benign lipomatous neoplasm of skin and subcutaneous tissue of right leg: Secondary | ICD-10-CM

## 2023-05-16 DIAGNOSIS — Z23 Encounter for immunization: Secondary | ICD-10-CM

## 2023-05-16 DIAGNOSIS — Z7185 Encounter for immunization safety counseling: Secondary | ICD-10-CM

## 2023-05-16 MED ORDER — CEFADROXIL 500 MG PO CAPS: 1000.0000 mg | ORAL_CAPSULE | Freq: Two times a day (BID) | ORAL | 11 refills | Status: DC

## 2023-05-16 NOTE — Progress Notes (Signed)
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   MRN: [id number]  HPI   63 year old woman who previously had an L4-5/5-1 decompression and PSIF who presented with purulent drainage and worsening back pain.  Along with MSSA bacteremia CT showed loosening of the L4 screws, Dr. Johnsie Cancel took her to the operating room for washout and revision.The patient's prior incision was opened with copious purulence down to the epidural space. This was cultured, evacuated, and copiously irrigated. Infected material was debrided, sutures were removed, and the wound edges were debrided of necrotic material. The hardware was dissected bilaterally and the L4 screws were grossly loose and removed with fingertips without requiring a driver. These were then replaced with upsized screws bilaterally with improved purchase. The rod was replaced, new caps were placed x6.   She had been on cefazolin  completeD on March 24, 2022.  Interim history:  "She had also been on rifampin given concern that the new hardware is infected.  Shewas having great deal of itching associated with her PICC line dressing   She was also having trouble tolerating rifampin which was making her nauseous and also causing diarrhea.  Back pain was  minimal the second to last visit I saw her.  We stopped her rifampin and switch her to oral cefadroxil.  4 days prior to her last visit she  had increased inferior aspect of her wound.  She called neurosurgery and I believe Dr. Dutch Quint prescribed doxycycline which she started to take along with the cefadroxil.  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  Have concerned that she might need further debridement given the worsening drainage and that she might need imaging certainly.  I had her follow-up my  partner Dr. Luciana Axe.  At that point in time the drainage had resolved and her pain was much improved.  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   She still has some back pain though it is improved compared to last visit.    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 No pertinent family history.    Social History   Socioeconomic History   Marital status: Divorced    Spouse name: Not on file  Number of children: 2   Years of education: Not on file   Highest education level: Not on file  Occupational History   Not on file  Tobacco Use   Smoking status: Every Day    Current packs/day: 0.30    Average packs/day: 0.3 packs/day for 40.0 years (12.0 ttl pk-yrs)    Types: Cigarettes   Smokeless tobacco: Never  Vaping Use   Vaping status: Never Used  Substance and Sexual Activity   Alcohol use: Never   Drug use: Yes    Types: Marijuana   Sexual activity: Not on file  Other Topics Concern   Not on file  Social History Narrative   Not on file   Social Drivers of  Health   Financial Resource Strain: Patient Declined (07/07/2022)   Received from [facility], [facility]   Overall Financial Resource Strain (CARDIA)    Difficulty of Paying Living Expenses: Patient declined  Food Insecurity: Patient Declined (07/07/2022)   Received from [facility], [facility]   Hunger Vital Sign    Worried About Running Out of Food in the Last Year: Patient declined    Ran Out of Food in the Last Year: Patient declined  Transportation Needs: Patient Declined (07/07/2022)   Received from [facility], [facility]   [facility] - Transportation    Lack of Transportation (Medical): Patient declined    Lack of Transportation (Non-Medical): Patient declined  Physical Activity: Inactive (11/15/2021)   [facility]   Exercise Vital Sign    Days of Exercise per Week: 0 days    Minutes of Exercise per Session: 0 min  Stress: Stress Concern Present (11/15/2021)   Received from [facility], [facility], [facility]   [employer] of [employer]ational [employer] - Occupational Stress Questionnaire    Feeling of Stress : Very much  Social Connections: Unknown (12/06/2022)   Received from [facility]   Social Network    Social Network: Not on file    Allergies  Allergen Reactions   Chlorhexidine Itching    Can tolerate chg soap if rinsed off, does not tolerate wipes.     Current Outpatient Medications:    acetaminophen (TYLENOL) 325 MG tablet, Take 650 mg by mouth every 6 (six) hours as needed for moderate pain or headache., Disp: , Rfl:    albuterol (VENTOLIN HFA) 108 (90 Base) MCG/ACT inhaler, Inhale 1 puff into the lungs every 6 (six) hours as needed for shortness of breath or wheezing., Disp: , Rfl:    amLODipine (NORVASC) 10 MG tablet, Take 10 mg by mouth daily., Disp: , Rfl:    aspirin 81 MG EC tablet, Take 81 mg by mouth daily., Disp: , Rfl:    atorvastatin (LIPITOR) 20 MG tablet, Take 20 mg by mouth at  bedtime., Disp: , Rfl:    buPROPion (WELLBUTRIN SR) 150 MG 12 hr tablet, Take 150 mg by mouth daily., Disp: , Rfl:    Cholecalciferol (VITAMIN D3 PO), Take 1 tablet by mouth daily., Disp: , Rfl:    cyclobenzaprine (FLEXERIL) 10 MG tablet, Take 10 mg by mouth 3 (three) times daily as needed. for muscle spams, Disp: , Rfl:    escitalopram (LEXAPRO) 10 MG tablet, Take 10 mg by mouth daily., Disp: , Rfl:    hydrOXYzine (ATARAX) 25 MG tablet, Take 25-50 mg by mouth See admin instructions. Take 25 mg by mouth in the morning and 50 mg at bedtime, Disp: , Rfl:  mometasone-formoterol (DULERA) 200-5 MCG/ACT AERO, Inhale 2 puffs into the lungs 2 (two) times daily., Disp: , Rfl:    Multiple Vitamins-Minerals (WOMENS MULTIVITAMIN) TABS, Take 1 tablet by mouth daily., Disp: , Rfl:    NON FORMULARY, Pt uses a cpap nightly, Disp: , Rfl:    Omega-3 1000 MG CAPS, Take 1,000 mg by mouth daily., Disp: , Rfl:    omeprazole (PRILOSEC) 40 MG capsule, Take 40 mg by mouth 2 (two) times daily., Disp: , Rfl:    primidone (MYSOLINE) 50 MG tablet, Take 100 mg by mouth at bedtime., Disp: , Rfl:    QUEtiapine (SEROQUEL) 200 MG tablet, Take 200 mg by mouth at bedtime., Disp: , Rfl:    traZODone (DESYREL) 150 MG tablet, Take 300 mg by mouth at bedtime., Disp: , Rfl:    triamcinolone cream (KENALOG) 0.1 %, Apply 1 application  topically daily as needed (itching)., Disp: , Rfl:    cefadroxil (DURICEF) 500 MG capsule, Take 2 capsules (1,000 mg total) by mouth 2 (two) times daily., Disp: 120 capsule, Rfl: 11   gabapentin (NEURONTIN) 600 MG tablet, Take 600 mg by mouth 3 (three) times daily. (Patient not taking: Reported on 07/28/2022), Disp: , Rfl:    oxyCODONE-acetaminophen (PERCOCET) 10-325 MG tablet, Take 1 tablet by mouth every 6 (six) hours as needed. (Patient not taking: Reported on 05/16/2023), Disp: , Rfl:    Review of Systems  Constitutional:  Negative for activity change, appetite change, chills, diaphoresis, fatigue,  fever and unexpected weight change.  HENT:  Negative for congestion, rhinorrhea, sinus pressure, sneezing, sore throat and trouble swallowing.   Eyes:  Negative for photophobia and visual disturbance.  Respiratory:  Negative for cough, chest tightness, shortness of breath, wheezing and stridor.   Cardiovascular:  Negative for chest pain, palpitations and leg swelling.  Gastrointestinal:  Negative for abdominal distention, abdominal pain, anal bleeding, blood in stool, constipation, diarrhea, nausea and vomiting.  Genitourinary:  Negative for difficulty urinating, dysuria, flank pain and hematuria.  Musculoskeletal:  Negative for arthralgias, back pain, gait problem, joint swelling and myalgias.  Skin:  Negative for color change, pallor, rash and wound.  Neurological:  Negative for dizziness, tremors, weakness and light-headedness.  Hematological:  Negative for adenopathy. Does not bruise/bleed easily.  Psychiatric/Behavioral:  Negative for agitation, behavioral problems, confusion, decreased concentration, dysphoric mood and sleep disturbance.        Objective:   Physical Exam Constitutional:      General: She is not in acute distress.    Appearance: Normal appearance. She is well-developed. She is not ill-appearing or diaphoretic.  HENT:     Head: Normocephalic and atraumatic.     Right Ear: Hearing and external ear normal.     Left Ear: Hearing and external ear normal.     Nose: No nasal deformity or rhinorrhea.  Eyes:     General: No scleral icterus.    Conjunctiva/sclera: Conjunctivae normal.     Right eye: Right conjunctiva is not injected.     Left eye: Left conjunctiva is not injected.     Pupils: Pupils are equal, round, and reactive to light.  Neck:     Vascular: No JVD.  Cardiovascular:     Rate and Rhythm: Normal rate and regular rhythm.     Heart sounds: Normal heart sounds, S1 normal and S2 normal. No murmur heard.    No friction rub.  Abdominal:     General: Bowel  sounds are normal. There is no distension.  Palpations: Abdomen is soft.     Tenderness: There is no abdominal tenderness.  Musculoskeletal:     Right shoulder: Normal.     Left shoulder: Normal.     Cervical back: Normal range of motion and neck supple.     Right hip: Normal.     Left hip: Normal.     Right knee: Normal.     Left knee: Normal.  Lymphadenopathy:     Head:     Right side of head: No submandibular, preauricular or posterior auricular adenopathy.     Left side of head: No submandibular, preauricular or posterior auricular adenopathy.     Cervical: No cervical adenopathy.     Right cervical: No superficial or deep cervical adenopathy.    Left cervical: No superficial or deep cervical adenopathy.  Skin:    General: Skin is warm and dry.     Coloration: Skin is not pale.     Findings: No abrasion, bruising, ecchymosis, erythema, lesion or rash.     Nails: There is no clubbing.  Neurological:     Mental Status: She is alert and oriented to person, place, and time.     Sensory: No sensory deficit.     Coordination: Coordination normal.     Gait: Gait normal.  Psychiatric:        Attention and Perception: She is attentive.        Mood and Affect: Mood normal.        Speech: Speech normal.        Behavior: Behavior normal. Behavior is cooperative.        Thought Content: Thought content normal.        Judgment: Judgment normal.    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  She did voice that she was allergic to topical  antiseptic so this will need to be further clarified if she is to use such an agent  Smoker: encouraged switch to vaping as pathway to stopping altogether   Vaccine counseling: recommended updated COVID and flu vaccinations as well as prevnar 20 which she received  Mrs. Had bacteremia due to infected hardware and lumbar spine post neurosurgery:  Recheck sed rate CRP CBC with differential and BMP.  She will continue on cefadroxil.  I spent 28 minutes with the patient including t in face to face counseling of the patient and the above problems personally along with review of medical records in preparation for the visit and during the visit and in coordination of her care.

## 2023-05-17 LAB — BASIC METABOLIC PANEL WITH GFR
BUN: 11 mg/dL (ref 7–25)
CO2: 29 mmol/L (ref 20–32)
Calcium: 9.6 mg/dL (ref 8.6–10.4)
Chloride: 105 mmol/L (ref 98–110)
Creat: 0.73 mg/dL (ref 0.50–1.05)
Glucose, Bld: 106 mg/dL — ABNORMAL HIGH (ref 65–99)
Potassium: 4.7 mmol/L (ref 3.5–5.3)
Sodium: 142 mmol/L (ref 135–146)
eGFR: 92 mL/min/{1.73_m2} (ref >=60)

## 2023-05-17 LAB — C-REACTIVE PROTEIN: CRP: 4.5 mg/L (ref <8.0)

## 2023-05-17 LAB — CBC WITH DIFFERENTIAL/PLATELET
Absolute Lymphocytes: 2387 {cells}/uL (ref 850–3900)
Absolute Monocytes: 428 {cells}/uL (ref 200–950)
Basophils Absolute: 41 {cells}/uL (ref 0–200)
Basophils Relative: 0.6 %
Eosinophils Absolute: 138 {cells}/uL (ref 15–500)
Eosinophils Relative: 2 %
HCT: 38.3 % (ref 35.0–45.0)
Hemoglobin: 12.7 g/dL (ref 11.7–15.5)
MCH: 29.6 pg (ref 27.0–33.0)
MCHC: 33.2 g/dL (ref 32.0–36.0)
MCV: 89.3 fL (ref 80.0–100.0)
MPV: 11.3 fL (ref 7.5–12.5)
Monocytes Relative: 6.2 %
Neutro Abs: 3905 {cells}/uL (ref 1500–7800)
Neutrophils Relative %: 56.6 %
Platelets: 195 10*3/uL (ref 140–400)
RBC: 4.29 10*6/uL (ref 3.80–5.10)
RDW: 12.8 % (ref 11.0–15.0)
Total Lymphocyte: 34.6 %
WBC: 6.9 10*3/uL (ref 3.8–10.8)

## 2023-05-17 LAB — SEDIMENTATION RATE: Sed Rate: 29 mm/h (ref 0–30)

## 2023-10-26 NOTE — Progress Notes (Signed)
 [facility] Return Patient Visit   Subjective   Patient Information: Brittney Hunt is a 63 y.o. female with PMH HTN, mood disorder (followed by psychiatry), GERD, back pain, COPD, tremors who comes for neurologic follow up. Per the initial clinic visit from 08/26/2021, Of note, the referral was for tremors but the patient also mentions concern for cognition at this visit.  Regarding the tremors, she says that a doctor did what sounds like an EMG study back in 2018 noted that the patient had tremors at that time.  She says that she had not really noticed tremors up until that time but did notice it afterwards.  She says the tremors have progressively gotten worse since then.  Tremors are notable when using the hands.  Has trouble holding things.  She does say that her dad had tremors as well.  Patient also says that she forgets a lot of day-to-day type things.  Says her memory is so bad that it is embarrassing.  Notable meds include hydroxyzine  for itching, flexeril , meloxicam, trazadone, amlodipine , buproprion, lipitor, seroquel  50 mg qHS, omeprazole, gabapentin 600 mg TID. Only been on seroquel  about 3 months.   Diagnostic tests: MRI brain - n/a Labs - A1C 5.6, BMP unremarkable, CBC unremarkable neuropsych testing - ordered in 08/2021 but was unable to reach patient to schedule   Current meds: -primidone  150 mg qHS   Prior meds: -n/a   Interval History: Patient comes for follow-up.  She is unaccompanied at this visit.  She does endorse worsening tremors.  She is currently taking 150 mg of the primidone  at night.  Patient does also mention cognitive concerns.  There is a family history of dementia.   Past Medical History, Past Surgery History, Allergies, Social History, and Family History were reviewed and updated.    Medications were reviewed and reconciled.  Review of Systems is complete and negative except as noted.  Objective   BP 129/78   Pulse 82   Temp 97.6 F  (36.4 C) (Skin)   Resp 16   Ht 5' 5 (1.651 m)   Wt 268 lb (121.6 kg)   SpO2 96%   BMI 44.60 kg/m    Mental status: alert and oriented to medical situation. Good insight/awareness of symptoms  Language: speech clear and fluent. Answers questions appropriately  Cranial nerves: CN 2: tracks examiner CN 3,4,6: EOMI CN: 5: deferred CN 7: face symmetric CN 8: hearing grossly intact CN 9/10: deferred CN 11: positive head control CN 12: deferred  Motor exam: moves extremities symmetrically x 4  Sensory: deferred  Reflexes: deferred  Coordination: Intact finger-to-nose b/l without ataxia. Modest intention and postural tremor in both hands  Gait: routine gait intact  Impression  Brittney Hunt is a 63 y.o.female with PMH HTN, mood disorder (followed by psychiatry), GERD, back pain, COPD, tremors who comes for neurologic follow up.  Tremors have been ongoing since at least 2018, possibly even prior to that.  She has a family history of tremors in her dad.  She has gotten significant improvement with primidone  which essentially cements the diagnosis of essential tremor.  She is having worsening tremors of late and so we will increase the primidone .  With regards to her cognitive concerns, she has very good insight/self-awareness of symptoms which suggest a benign etiology and so I do not suspect a pathological dementia syndrome.   1. Essential tremor   2. Tremor   3. Subjective cognitive impairment       Plan  No orders of the defined types were placed in this encounter.   # Essential Tremor -increase primidone  to 100 mg AM, 150 mg qHS    # subjective cognitive complaints -her intact insight/self-awareness suggest a benign etiology    -follow up 6 months   All new prescription medications and changes in prescription dosages were discussed with the patient, including patient education, medication name, use, and dosage, potential side effects, drug interactions,  consequences of not using/taking and special instructions. The patient expressed understanding. No barriers to adherence are present. The aforementioned diagnosis, management plans, and prognosis were extensively reviewed with the patient who voiced understanding and agreed. This note was dictated with voice recognition software. Inadvertently, similar sounding words can sometimes get transcribed incorrectly.

## 2023-11-13 NOTE — Progress Notes (Deleted)
 Subjective:  Chief complaint :follow-up for lumbar wound complicated by infection   Patient ID: Brittney Hunt, female    DOB: [birth year], 63 y.o.   MRN: [id number]  HPI   63 year old woman who previously had an L4-5/5-1 decompression and PSIF who presented with purulent drainage and worsening back pain.  Along with MSSA bacteremia CT showed loosening of the L4 screws, Dr. Rockney took her to the operating room for washout and revision.The patient's prior incision was opened with copious purulence down to the epidural space. This was cultured, evacuated, and copiously irrigated. Infected material was debrided, sutures were removed, and the wound edges were debrided of necrotic material. The hardware was dissected bilaterally and the L4 screws were grossly loose and removed with fingertips without requiring a driver. These were then replaced with upsized screws bilaterally with improved purchase. The rod was replaced, new caps were placed x6.   She had been on cefazolin   completed on March 24, 2022.  Interim history:  She had also been on rifampin  given concern that the new hardware is infected.  Shewas having great deal of itching associated with her PICC line dressing   She was also having trouble tolerating rifampin  which was making her nauseous and also causing diarrhea.  Back pain was  minimal the second to last visit I saw her.  We stopped her rifampin  and switch her to oral cefadroxil .  4 days prior to her last visit she  had increased inferior aspect of her wound.  She called neurosurgery and I believe Dr. Malcolm prescribed doxycycline which she started to take along with the cefadroxil .  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  Have concerned that she might need further debridement given the worsening drainage and that she might need imaging certainly.  I had her follow-up  my partner Dr. Efrain.  At that point in time the drainage had resolved and her pain was much improved.  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    Social History   Socioeconomic History   Marital status: Divorced    Spouse name: Not on file   Number of children: 2   Years of education: Not on file   Highest  education level: Not on file  Occupational History   Not on file  Tobacco Use   Smoking status: Every Day    Current packs/day: 0.30    Average packs/day: 0.3 packs/day for 40.0 years (12.0 ttl pk-yrs)    Types: Cigarettes   Smokeless tobacco: Never  Vaping Use   Vaping status: Never Used  Substance and Sexual Activity   Alcohol use: Never   Drug use: Yes    Types: Marijuana   Sexual activity: Not on file  Other Topics Concern   Not on file  Social History Narrative   Not on file   Social Drivers of Health   Financial Resource Strain: Patient Declined (07/07/2022)   Received from [facility]   Overall Financial Resource Strain (CARDIA)    Difficulty of Paying Living Expenses: Patient declined  Food Insecurity: Patient Declined (07/07/2022)   Received from [facility]   Hunger Vital Sign    Within the past 12 months, you worried that your food would run out before you got the money to buy more.: Patient declined    Within the past 12 months, the food you bought just didn't last and you didn't have money to get more.: Patient declined  Transportation Needs: Patient Declined (07/07/2022)   Received from [facility] - Transportation    Lack of Transportation (Medical): Patient declined    Lack of Transportation (Non-Medical): Patient declined  Physical Activity: Inactive (11/15/2021)   Received from [facility]   Exercise Vital Sign    On average, how many days per week do you engage in moderate to strenuous exercise (like a brisk walk)?: 0 days    On average, how many minutes do you engage in exercise at this level?: 0 min  Stress: Stress Concern Present (11/15/2021)   Received from [facility] - Occupational Stress Questionnaire    Feeling of Stress : Very much  Social Connections: Unknown (12/06/2022)   Received from [facility]   Social Network    Social Network: Not on file    Allergies  Allergen Reactions   Chlorhexidine  Itching    Can tolerate chg soap if rinsed off, does not tolerate wipes.     Current Outpatient Medications:    acetaminophen  (TYLENOL ) 325 MG tablet, Take 650 mg by mouth every 6 (six) hours as needed for moderate pain or headache., Disp: , Rfl:    albuterol  (VENTOLIN  HFA) 108 (90 Base) MCG/ACT inhaler, Inhale 1 puff into the lungs every 6 (six) hours as needed for shortness of breath or wheezing., Disp: , Rfl:    amLODipine  (NORVASC ) 10 MG tablet, Take 10 mg by mouth daily., Disp: , Rfl:    aspirin  81 MG EC tablet, Take 81 mg by mouth daily., Disp: , Rfl:    atorvastatin   (LIPITOR) 20 MG tablet, Take 20 mg by mouth at bedtime., Disp: , Rfl:    buPROPion  (WELLBUTRIN  SR) 150 MG 12 hr tablet, Take 150 mg by mouth daily., Disp: , Rfl:    cefadroxil  (DURICEF) 500 MG capsule, Take 2 capsules (1,000 mg total) by mouth 2 (two) times daily., Disp: 120 capsule, Rfl: 11   Cholecalciferol  (VITAMIN D3 PO), Take 1 tablet by mouth daily., Disp: , Rfl:    cyclobenzaprine  (FLEXERIL ) 10 MG tablet, Take 10 mg by mouth 3 (three) times daily as needed. for muscle spams, Disp: , Rfl:    escitalopram (LEXAPRO) 10 MG tablet, Take 10 mg by  mouth daily., Disp: , Rfl:    gabapentin (NEURONTIN) 600 MG tablet, Take 600 mg by mouth 3 (three) times daily. (Patient not taking: Reported on 07/28/2022), Disp: , Rfl:    hydrOXYzine  (ATARAX ) 25 MG tablet, Take 25-50 mg by mouth See admin instructions. Take 25 mg by mouth in the morning and 50 mg at bedtime, Disp: , Rfl:    mometasone -formoterol  (DULERA ) 200-5 MCG/ACT AERO, Inhale 2 puffs into the lungs 2 (two) times daily., Disp: , Rfl:    Multiple Vitamins-Minerals (WOMENS MULTIVITAMIN) TABS, Take 1 tablet by mouth daily., Disp: , Rfl:    NON FORMULARY, Pt uses a cpap nightly, Disp: , Rfl:    Omega-3 1000 MG CAPS, Take 1,000 mg by mouth daily., Disp: , Rfl:    omeprazole (PRILOSEC) 40 MG capsule, Take 40 mg by mouth 2 (two) times daily., Disp: , Rfl:    oxyCODONE -acetaminophen  (PERCOCET) 10-325 MG tablet, Take 1 tablet by mouth every 6 (six) hours as needed. (Patient not taking: Reported on 05/16/2023), Disp: , Rfl:    primidone  (MYSOLINE ) 50 MG tablet, Take 100 mg by mouth at bedtime., Disp: , Rfl:    QUEtiapine  (SEROQUEL ) 200 MG tablet, Take 200 mg by mouth at bedtime., Disp: , Rfl:    traZODone  (DESYREL ) 150 MG tablet, Take 300 mg by mouth at bedtime., Disp: , Rfl:    triamcinolone  cream (KENALOG ) 0.1 %, Apply 1 application  topically daily as needed (itching)., Disp: , Rfl:    Review of Systems     Objective:   Physical Exam   Palpable soft  tissue mass behind right knee     Assessment & Plan:

## 2023-11-14 ENCOUNTER — Ambulatory Visit: Payer: 59 | Admitting: Infectious Disease

## 2023-11-14 DIAGNOSIS — T8149XA Infection following a procedure, other surgical site, initial encounter: Secondary | ICD-10-CM

## 2023-11-14 DIAGNOSIS — R7881 Bacteremia: Secondary | ICD-10-CM

## 2023-11-14 DIAGNOSIS — T847XXD Infection and inflammatory reaction due to other internal orthopedic prosthetic devices, implants and grafts, subsequent encounter: Secondary | ICD-10-CM

## 2023-11-14 DIAGNOSIS — Z7185 Encounter for immunization safety counseling: Secondary | ICD-10-CM

## 2023-11-14 DIAGNOSIS — M4626 Osteomyelitis of vertebra, lumbar region: Secondary | ICD-10-CM

## 2024-05-28 ENCOUNTER — Other Ambulatory Visit: Payer: Self-pay | Admitting: Infectious Disease

## 2024-05-29 NOTE — Telephone Encounter (Signed)
 Received the following appointment request from patient:    Appointment Request From: Mitzie Dixons   With Provider: Jomarie Fleeta Rothman, MD [facility]]   Preferred Date Range: Any   Preferred Times: Any   Reason for visit: Office Visit   Health Maintenance Topic:    Comments: Cefadroxal for staph infection.  Going to have surgery soon. Should I keep taking them .

## 2024-05-30 ENCOUNTER — Ambulatory Visit: Admitting: Physician Assistant

## 2024-05-30 ENCOUNTER — Encounter: Payer: Self-pay | Admitting: Physician Assistant

## 2024-05-30 VITALS — BP 126/70 | HR 90 | Ht 67.0 in | Wt 260.1 lb

## 2024-05-30 DIAGNOSIS — E7800 Pure hypercholesterolemia, unspecified: Secondary | ICD-10-CM

## 2024-05-30 DIAGNOSIS — I10 Essential (primary) hypertension: Secondary | ICD-10-CM | POA: Diagnosis not present

## 2024-05-30 DIAGNOSIS — R072 Precordial pain: Secondary | ICD-10-CM | POA: Diagnosis not present

## 2024-05-30 DIAGNOSIS — R9431 Abnormal electrocardiogram [ECG] [EKG]: Secondary | ICD-10-CM | POA: Diagnosis not present

## 2024-05-30 MED ORDER — METOPROLOL TARTRATE 100 MG PO TABS: 100.0000 mg | ORAL_TABLET | Freq: Once | ORAL | 0 refills | Status: AC

## 2024-05-30 MED ORDER — NITROGLYCERIN 0.4 MG SL SUBL: 0.4000 mg | SUBLINGUAL_TABLET | SUBLINGUAL | 0 refills | Status: AC | PRN

## 2024-05-30 NOTE — Patient Instructions (Signed)
 Medication Instructions:  Your physician recommends that you continue on your current medications as directed. Please refer to the Current Medication list given to you today.  We did send in some Nitroglycerin  for you to take ONLY AS NEEDED... The proper use and anticipated side effects of nitroglycerine has been carefully explained.  If a single episode of chest pain is not relieved by one tablet, the patient will try another within 5 minutes; and if this doesn't relieve the pain, the patient is instructed to call 911 for transportation to an emergency department.  *If you need a refill on your cardiac medications before your next appointment, please call your pharmacy*  Lab Work: None ordered  If you have labs (blood work) drawn today and your tests are completely normal, you will receive your results only by: MyChart Message (if you have MyChart) OR A paper copy in the mail If you have any lab test that is abnormal or we need to change your treatment, we will call you to review the results.  Testing/Procedures: Your physician has requested that you have an echocardiogram TOMORROW, 05/31/24, ARRIVE AT 11:14. Echocardiography is a painless test that uses sound waves to create images of your heart. It provides your doctor with information about the size and shape of your heart and how well your hearts chambers and valves are working. This procedure takes approximately one hour. There are no restrictions for this procedure. Please do NOT wear cologne, perfume, aftershave, or lotions (deodorant is allowed). Please arrive 15 minutes prior to your appointment time.  Please note: We ask at that you not bring children with you during ultrasound (echo/ vascular) testing. Due to room size and safety concerns, children are not allowed in the ultrasound rooms during exams. Our front office staff cannot provide observation of children in our lobby area while testing is being conducted. An adult accompanying  a patient to their appointment will only be allowed in the ultrasound room at the discretion of the ultrasound technician under special circumstances. We apologize for any inconvenience.   Your physician has requested that you have cardiac CT. Cardiac computed tomography (CT) is a painless test that uses an x-ray machine to take clear, detailed pictures of your heart. For further information please visit [email or web address] Please follow instruction sheet BELOW:    Your cardiac CT will be scheduled at one of the below locations:    [facility] [address]  [place], [place] [address]  2/17 ARRIVING AT 3PM FOR 330 TEST  If scheduled at the Heart and Vascular Tower at [facility] street, please enter the parking lot using the [address] entrance and use the FREE valet service at the patient drop-off area. Enter the building and check-in with registration on the main floor.   Please follow these instructions carefully (unless otherwise directed):  An IV will be required for this test and Nitroglycerin  will be given.    On the Night Before the Test: Be sure to Drink plenty of water . Do not consume any caffeinated/decaffeinated beverages or chocolate 12 hours prior to your test. Do not take any antihistamines 12 hours prior to your test.   On the Day of the Test: Drink plenty of water  until 1 hour prior to the test. Do not eat any food 1 hour prior to test. You may take your regular medications prior to the test.  Take metoprolol  (Lopressor ) 100 MG two hours prior to test. THIS HAS BEEN SENT TO YOUR PHARMACY.  GO AHEAD AND PICK IT UP If you take Furosemide/Hydrochlorothiazide/Spironolactone/Chlorthalidone, please HOLD on the morning of the test. FEMALES- please wear underwire-free bra if available, avoid dresses & tight clothing          After the Test: Drink plenty of water . After receiving IV contrast, you may experience a mild flushed feeling.  This is normal. On occasion, you may experience a mild rash up to 24 hours after the test. This is not dangerous. If this occurs, you can take Benadryl  25 mg, Zyrtec, Claritin, or Allegra and increase your fluid intake. (Patients taking Tikosyn should avoid Benadryl , and may take Zyrtec, Claritin, or Allegra) If you experience trouble breathing, this can be serious. If it is severe call 911 IMMEDIATELY. If it is mild, please call our office.  We will call to schedule your test 2-4 weeks out understanding that some insurance companies will need an authorization prior to the service being performed.   For more information and frequently asked questions, please visit our website : [email or web address]  For non-scheduling related questions, please contact the cardiac imaging nurse navigator should you have any questions/concerns: Cardiac Imaging Nurse Navigators Direct Office Dial: [phone]   For scheduling needs, including cancellations and rescheduling, please call Brittany, [phone].  For billing questions, please call [phone].    Follow-Up: At [facility], you and your health needs are our priority.  As part of our continuing mission to provide you with exceptional heart care, our providers are all part of one team.  This team includes your primary Cardiologist (physician) and Advanced Practice Providers or APPs (Physician Assistants and Nurse Practitioners) who all work together to provide you with the care you need, when you need it.  Your next appointment:   6 week(s)  Provider:   Lonni Cash, MD    We recommend signing up for the patient portal called MyChart.  Sign up information is provided on this After Visit Summary.  MyChart is used to connect with patients for Virtual Visits (Telemedicine).  Patients are able to view lab/test results, encounter notes, upcoming appointments, etc.  Non-urgent messages can be sent to your provider as  well.   To learn more about what you can do with MyChart, go to [email or web address].   Other Instructions         [facility]

## 2024-05-31 ENCOUNTER — Ambulatory Visit (HOSPITAL_COMMUNITY)

## 2024-06-12 ENCOUNTER — Ambulatory Visit (HOSPITAL_COMMUNITY)

## 2024-07-10 ENCOUNTER — Ambulatory Visit (HOSPITAL_COMMUNITY)

## 2024-07-11 ENCOUNTER — Ambulatory Visit: Admitting: Cardiovascular Disease
